# Patient Record
Sex: Female | Born: 1980 | Race: Asian | Hispanic: No | Smoking: Never smoker
Health system: Southern US, Community
[De-identification: ages and names within clinical notes are randomized; demographics above are authoritative.]

## PROBLEM LIST (undated history)

## (undated) ENCOUNTER — Emergency Department (HOSPITAL_COMMUNITY): Payer: Medicaid Other

## (undated) ENCOUNTER — Inpatient Hospital Stay (HOSPITAL_COMMUNITY): Payer: Self-pay

## (undated) DIAGNOSIS — N83209 Unspecified ovarian cyst, unspecified side: Secondary | ICD-10-CM

---

## 2007-10-15 HISTORY — PX: OTHER SURGICAL HISTORY: SHX169

## 2009-10-14 DIAGNOSIS — N83209 Unspecified ovarian cyst, unspecified side: Secondary | ICD-10-CM

## 2009-10-14 HISTORY — DX: Unspecified ovarian cyst, unspecified side: N83.209

## 2010-07-31 ENCOUNTER — Encounter: Admission: RE | Admit: 2010-07-31 | Discharge: 2010-07-31 | Payer: Self-pay | Admitting: Family Medicine

## 2010-08-07 ENCOUNTER — Encounter: Admission: RE | Admit: 2010-08-07 | Discharge: 2010-08-07 | Payer: Self-pay | Admitting: Family Medicine

## 2011-10-15 NOTE — L&D Delivery Note (Signed)
Delivery Note At 1:42 PM a viable female was delivered via Vaginal, Spontaneous Delivery (Presentation: Left Occiput Anterior).  APGAR: 8, 9; weight 7 lb 10.6 oz (3475 g).   Placenta status: Intact, Spontaneous.  Cord: 3 vessels with the following complications: None.  Cord pH: none  Anesthesia: Epidural  Episiotomy: None Lacerations: 2nd degree Suture Repair: 2.0 chromic Est. Blood Loss (mL): 350  Mom to postpartum.  Baby to nursery-stable.  HARPER,CHARLES A 08/29/2012, 3:48 PM

## 2011-10-28 ENCOUNTER — Ambulatory Visit: Payer: Self-pay

## 2011-10-28 DIAGNOSIS — B373 Candidiasis of vulva and vagina: Secondary | ICD-10-CM

## 2011-10-28 DIAGNOSIS — N898 Other specified noninflammatory disorders of vagina: Secondary | ICD-10-CM

## 2011-10-28 DIAGNOSIS — N760 Acute vaginitis: Secondary | ICD-10-CM

## 2012-01-19 ENCOUNTER — Ambulatory Visit: Payer: Self-pay | Admitting: Family Medicine

## 2012-01-19 VITALS — BP 121/67 | HR 83 | Temp 98.2°F | Resp 16 | Ht 60.0 in | Wt 104.0 lb

## 2012-01-19 DIAGNOSIS — N912 Amenorrhea, unspecified: Secondary | ICD-10-CM

## 2012-01-19 DIAGNOSIS — Z3201 Encounter for pregnancy test, result positive: Secondary | ICD-10-CM

## 2012-01-19 LAB — POCT URINE PREGNANCY: Preg Test, Ur: POSITIVE

## 2012-01-19 MED ORDER — PRENATAL VITAMINS (DIS) PO TABS: 1.0000 | ORAL_TABLET | Freq: Every morning | ORAL | Status: DC

## 2012-01-19 MED ORDER — ONDANSETRON 4 MG PO TBDP: 4.0000 mg | ORAL_TABLET | Freq: Three times a day (TID) | ORAL | Status: DC | PRN

## 2012-01-19 NOTE — Progress Notes (Signed)
  S: 31 yo [place] woman who suspects she may be pregnant because she missed her period last month:  LMP 12/11/11.  Also vomiting intermittently x 1 week.  No abdominal pain.  Voiding normally.  She had vomiting with her latest pregnancy.  O:  Thin woman in NAD Results for orders placed in visit on 01/19/12  POCT URINE PREGNANCY      Component Value Range   Preg Test, Ur Positive      A:  New onset pregnancy  P:  Zofran Prenatal vitamins Refer to [employer]

## 2012-01-19 NOTE — Patient Instructions (Signed)
Call [facility]  At [phone] for pregnancy care    Pregnancy If you are planning on getting pregnant, it is a good idea to make a preconception appointment with your care- giver to discuss having a healthy lifestyle before getting pregnant. Such as, diet, weight, exercise, taking prenatal vitamins especially folic acid (it helps prevent brain and spinal cord defects), avoiding alcohol, smoking and illegal drugs, medical problems (diabetes, convulsions), family history of genetic problems, working conditions and immunizations. It is better to have knowledge of these things and do something about them before getting pregnant. In your pregnancy, it is important to follow certain guidelines to have a healthy baby. It is very important to get good prenatal care and follow your caregiver's instructions. Prenatal care includes all the medical care you receive before your baby's birth. This helps to prevent problems during the pregnancy and childbirth. HOME CARE INSTRUCTIONS   Start your prenatal visits by the 12th week of pregnancy or before when possible. They are usually scheduled monthly at first. They are more often in the last 2 months before delivery. It is important that you keep your caregiver's appointments and follow your caregiver's instructions regarding medication use, exercise, and diet.   During pregnancy, you are providing food for you and your baby. Eat a regular, well-balanced diet. Choose foods such as meat, fish, milk and other dairy products, vegetables, fruits, whole-grain breads and cereals. Your caregiver will inform you of the ideal weight gain depending on your current height and weight. Drink lots of liquids. Try to drink 8 glasses of water a day.   Alcohol is associated with a number of birth defects including fetal alcohol syndrome. It is best to avoid alcohol completely. Smoking will cause low birth rate and prematurity. Use of alcohol and nicotine during your pregnancy also  increases the chances that your child will be chemically dependent later in their life and may contribute to SIDS (Sudden Infant Death Syndrome).   Do not use illegal drugs.   Only take prescription or over-the-counter medications that are recommended by your caregiver. Other medications can cause genetic and physical problems in the baby.   Morning sickness can often be helped by keeping soda crackers at the bedside. Eat a couple before arising in the morning.   A sexual relationship may be continued until near the end of pregnancy if there are no other problems such as early (premature) leaking of amniotic fluid from the membranes, vaginal bleeding, painful intercourse or belly (abdominal) pain.   Exercise regularly. Check with your caregiver if you are unsure of the safety of some of your exercises.   Do not use hot tubs, steam rooms or saunas. These increase the risk of fainting or passing out and hurting yourself and the baby. Swimming is OK for exercise. Get plenty of rest, including afternoon naps when possible especially in the third trimester.   Avoid toxic odors and chemicals.   Do not wear high heels. They may cause you to lose your balance and fall.   Do not lift over 5 pounds. If you do lift anything, lift with your legs and thighs, not your back.   Avoid long trips, especially in the third trimester.   If you have to travel out of the city or state, take a copy of your medical records with you.  SEEK IMMEDIATE MEDICAL CARE IF:   You develop an unexplained oral temperature above 102 F (38.9 C), or as your caregiver suggests.   You have  leaking of fluid from the vagina. If leaking membranes are suspected, take your temperature and inform your caregiver of this when you call.   There is vaginal spotting or bleeding. Notify your caregiver of the amount and how many pads are used.   You continue to feel sick to your stomach (nauseous) and have no relief from remedies  suggested, or you throw up (vomit) blood or coffee ground like materials.   You develop upper abdominal pain.   You have round ligament discomfort in the lower abdominal area. This still must be evaluated by your caregiver.   You feel contractions of the uterus.   You do not feel the baby move, or there is less movement than before.   You have painful urination.   You have abnormal vaginal discharge.   You have persistent diarrhea.   You get a severe headache.   You have problems with your vision.   You develop muscle weakness.   You feel dizzy and faint.   You develop shortness of breath.   You develop chest pain.   You have back pain that travels down to your leg and feet.   You feel irregular or a very fast heartbeat.   You develop excessive weight gain in a short period of time (5 pounds in 3 to 5 days).   You are involved with a domestic violence situation.  Document Released: 09/30/2005 Document Revised: 09/19/2011 Document Reviewed: 03/24/2009 [facility] Patient Information 2012 [place], [place].

## 2012-01-27 ENCOUNTER — Encounter (HOSPITAL_COMMUNITY): Payer: Self-pay

## 2012-01-27 ENCOUNTER — Inpatient Hospital Stay (HOSPITAL_COMMUNITY): Payer: Medicaid Other

## 2012-01-27 ENCOUNTER — Inpatient Hospital Stay (HOSPITAL_COMMUNITY)
Admission: AD | Admit: 2012-01-27 | Discharge: 2012-01-27 | Disposition: A | Discharge status: Home / Self Care | Payer: Medicaid Other | Source: Ambulatory Visit | Attending: Obstetrics and Gynecology | Admitting: Obstetrics and Gynecology

## 2012-01-27 DIAGNOSIS — O209 Hemorrhage in early pregnancy, unspecified: Secondary | ICD-10-CM | POA: Insufficient documentation

## 2012-01-27 HISTORY — DX: Unspecified ovarian cyst, unspecified side: N83.209

## 2012-01-27 LAB — CBC
Platelets: 306 10*3/uL (ref 150–400)
RBC: 4.96 MIL/uL (ref 3.87–5.11)
RDW: 12.4 % (ref 11.5–15.5)
WBC: 11.5 10*3/uL — ABNORMAL HIGH (ref 4.0–10.5)

## 2012-01-27 LAB — HCG, QUANTITATIVE, PREGNANCY: hCG, Beta Chain, Quant, S: 179394 m[IU]/mL — ABNORMAL HIGH (ref <5)

## 2012-01-27 LAB — WET PREP, GENITAL
Trich, Wet Prep: NONE SEEN
Yeast Wet Prep HPF POC: NONE SEEN

## 2012-01-27 NOTE — Discharge Instructions (Signed)
Pelvic Rest Pelvic rest is sometimes recommended for women when:   The placenta is partially or completely covering the opening of the cervix (placenta previa).   There is bleeding between the uterine wall and the amniotic sac in the first trimester (subchorionic hemorrhage).   The cervix begins to open without labor starting (incompetent cervix, cervical insufficiency).   The labor is too early (preterm labor).  HOME CARE INSTRUCTIONS  Do not have sexual intercourse, stimulation, or an orgasm.   Do not use tampons, douche, or put anything in the vagina.   Do not lift anything over 10 pounds (4.5 kg).   Avoid strenuous activity or straining your pelvic muscles.  SEEK MEDICAL CARE IF:  You have any vaginal bleeding during pregnancy. Treat this as a potential emergency.   You have cramping pain felt low in the stomach (stronger than menstrual cramps).   You notice vaginal discharge (watery, mucus, or bloody).   You have a low, dull backache.   There are regular contractions or uterine tightening.  SEEK IMMEDIATE MEDICAL CARE IF: You have vaginal bleeding and have placenta previa.  Document Released: 01/25/2011 Document Revised: 09/19/2011 Document Reviewed: 01/25/2011 [employer] Patient Information 2012 [employer].Vaginal Bleeding During Pregnancy, First Trimester A small amount of bleeding (spotting) is relatively common in early pregnancy. It usually stops on its own. There are many causes for bleeding or spotting in early pregnancy. Some bleeding may be related to the pregnancy and some may not. Cramping with the bleeding is more serious and concerning. Tell your caregiver if you have any vaginal bleeding.  CAUSES   It is normal in most cases.   The pregnancy ends (miscarriage).   The pregnancy may end (threatened miscarriage).   Infection or inflammation of the cervix.   Growths (polyps) on the cervix.   Pregnancy happens outside of the uterus and in a fallopian  tube (tubal pregnancy).   Many tiny cysts in the uterus instead of pregnancy tissue (molar pregnancy).  SYMPTOMS  Vaginal bleeding or spotting with or without cramps. DIAGNOSIS  To evaluate the pregnancy, your caregiver may:  Do a pelvic exam.   Take blood tests.   Do an ultrasound.  It is very important to follow your caregiver's instructions.  TREATMENT   Evaluation of the pregnancy with blood tests and ultrasound.   Bed rest (getting up to use the bathroom only).   Rho-gam immunization if the mother is Rh negative and the father is Rh positive.  HOME CARE INSTRUCTIONS   If your caregiver orders bed rest, you may need to make arrangements for the care of other children and for other responsibilities. However, your caregiver may allow you to continue light activity.   Keep track of the number of pads you use each day, how often you change pads and how soaked (saturated) they are. Write this down.   Do not use tampons. Do not douche.   Do not have sexual intercourse or orgasms until approved by your physician.   Save any tissue that you pass for your caregiver to see.   Take medicine for cramps only with your caregiver's permission.   Do not take aspirin because it can make you bleed.  SEEK IMMEDIATE MEDICAL CARE IF:   You experience severe cramps in your stomach, back or belly (abdomen).   You have an oral temperature above 102 F (38.9 C), not controlled by medicine.   You pass large clots or tissue.   Your bleeding increases or you become   light-headed, weak or have fainting episodes.   You develop chills.   You are leaking or have a gush of fluid from your vagina.   You pass out while having a bowel movement. That may mean you have a ruptured tubal pregnancy.  Document Released: 07/10/2005 Document Revised: 09/19/2011 Document Reviewed: 01/19/2009 [employer] Patient Information 2012 [employer]. 

## 2012-01-27 NOTE — MAU Note (Signed)
Patient states she had a positive pregnancy test at Urgent Care. Started having spotting at 1000. Has some off and on pain in lower abdomen and lower back.

## 2012-01-27 NOTE — MAU Provider Note (Signed)
History   Alexandra Lynn is a 31 y.o. year old G68P1001 female at [redacted]w[redacted]d weeks gestation by LMP who presents to MAU reporting vaginal bleeding lighter than a period and light cramping. She denies passage of tissue or any urinary complaints.    CSN: [id number]  Arrival date and time: 01/27/12 1058   First Provider Initiated Contact with Patient 01/27/12 1411      Chief Complaint  Patient presents with  . Vaginal Bleeding   HPI  OB History    Grav Para Term Preterm Abortions TAB SAB Ect Mult Living   2 1 1  0 0 0 0 0 0 1      Past Medical History  Diagnosis Date  . Ovarian cyst 2011    Past Surgical History  Procedure Date  . Laproscopic 2009    removal of fluid from ovary    Family History  Problem Relation Age of Onset  . Hearing loss Neg Hx   . Anesthesia problems Neg Hx     History  Substance Use Topics  . Smoking status: Never Smoker   . Smokeless tobacco: Never Used  . Alcohol Use: No    Allergies: No Known Allergies  Prescriptions prior to admission  Medication Sig Dispense Refill  . Prenatal Vit-Fe Fumarate-FA (PRENATAL MULTIVITAMIN) TABS Take 1 tablet by mouth daily.        ROS: Otherwise neg Physical Exam   Blood pressure 107/64, pulse 75, temperature 97.3 F (36.3 C), temperature source Oral, resp. rate 16, height 4\' 11"  (1.499 m), weight 46.811 kg (103 lb 3.2 oz), last menstrual period 12/05/2011, SpO2 100.00%.  Physical Exam  Nursing note and vitals reviewed. Constitutional: She is oriented to person, place, and time. She appears well-developed and well-nourished. No distress.  Cardiovascular: Normal rate.   Respiratory: Effort normal.  GI: Soft.  Genitourinary: There is no lesion on the right labia. There is no lesion on the left labia. Cervix exhibits friability. Cervix exhibits no discharge. There is bleeding (scant amount of brown blood in vault) around the vagina. No vaginal discharge found.       Declined bimanual exam. Cervix tightly  closed when GC/CT swab introduced.   Lymphadenopathy:       Right: No inguinal adenopathy present.       Left: No inguinal adenopathy present.  Neurological: She is alert and oriented to person, place, and time.  Skin: Skin is warm and dry.  Psychiatric: She has a normal mood and affect.   Results for orders placed during the hospital encounter of 01/27/12 (from the past 24 hour(s))  CBC     Status: Abnormal   Collection Time   01/27/12 11:25 AM      Component Value Range   WBC 11.5 (*) 4.0 - 10.5 (K/uL)   RBC 4.96  3.87 - 5.11 (MIL/uL)   Hemoglobin 13.2  12.0 - 15.0 (g/dL)   HCT 14.7  82.9 - 56.2 (%)   MCV 80.2  78.0 - 100.0 (fL)   MCH 26.6  26.0 - 34.0 (pg)   MCHC 33.2  30.0 - 36.0 (g/dL)   RDW 13.0  86.5 - 78.4 (%)   Platelets 306  150 - 400 (K/uL)  ABO/RH     Status: Normal   Collection Time   01/27/12 11:25 AM      Component Value Range   ABO/RH(D) B POS    HCG, QUANTITATIVE, PREGNANCY     Status: Abnormal   Collection Time   01/27/12 11:25 AM  Component Value Range   hCG, Beta Chain, [place] Longest [id number] (*) <5 (mIU/mL)   [place]: SIUP, S=D  MAU Course  Procedures   Assessment and Plan   1. First trimester bleeding    D/C home Start [facility] Pelvic rest x 1 week SAB precautions Prenatal provider list given  Dorathy Kinsman 01/27/2012, 2:41 PM

## 2012-01-27 NOTE — MAU Note (Signed)
Had bleeding this morning around 1000, like period. One or two small clots. No active bleeding at this time.

## 2012-01-30 NOTE — MAU Provider Note (Signed)
Agree with above note.  Alexandra Lynn 01/30/2012 11:15 AM

## 2012-03-24 LAB — OB RESULTS CONSOLE HIV ANTIBODY (ROUTINE TESTING): HIV: NONREACTIVE

## 2012-03-24 LAB — OB RESULTS CONSOLE ANTIBODY SCREEN: Antibody Screen: NEGATIVE

## 2012-03-24 LAB — OB RESULTS CONSOLE RPR: RPR: NONREACTIVE

## 2012-03-27 ENCOUNTER — Other Ambulatory Visit: Payer: Self-pay | Admitting: Obstetrics & Gynecology

## 2012-03-27 DIAGNOSIS — Z3689 Encounter for other specified antenatal screening: Secondary | ICD-10-CM

## 2012-04-23 ENCOUNTER — Ambulatory Visit (HOSPITAL_COMMUNITY)
Admission: RE | Admit: 2012-04-23 | Discharge: 2012-04-23 | Disposition: A | Discharge status: Home / Self Care | Payer: Medicaid Other | Source: Ambulatory Visit | Attending: Obstetrics & Gynecology | Admitting: Obstetrics & Gynecology

## 2012-04-23 DIAGNOSIS — Z363 Encounter for antenatal screening for malformations: Secondary | ICD-10-CM | POA: Insufficient documentation

## 2012-04-23 DIAGNOSIS — O358XX0 Maternal care for other (suspected) fetal abnormality and damage, not applicable or unspecified: Secondary | ICD-10-CM | POA: Insufficient documentation

## 2012-04-23 DIAGNOSIS — Z3689 Encounter for other specified antenatal screening: Secondary | ICD-10-CM

## 2012-04-23 DIAGNOSIS — Z1389 Encounter for screening for other disorder: Secondary | ICD-10-CM | POA: Insufficient documentation

## 2012-05-21 ENCOUNTER — Other Ambulatory Visit: Payer: Self-pay

## 2012-07-06 ENCOUNTER — Encounter (HOSPITAL_COMMUNITY): Payer: Self-pay | Admitting: Obstetrics & Gynecology

## 2012-07-10 ENCOUNTER — Ambulatory Visit (HOSPITAL_COMMUNITY)
Admission: RE | Admit: 2012-07-10 | Discharge: 2012-07-10 | Disposition: A | Discharge status: Home / Self Care | Payer: Medicaid Other | Source: Ambulatory Visit | Attending: Obstetrics & Gynecology | Admitting: Obstetrics & Gynecology

## 2012-07-13 DIAGNOSIS — D560 Alpha thalassemia: Secondary | ICD-10-CM | POA: Insufficient documentation

## 2012-07-13 NOTE — Progress Notes (Signed)
Genetic Counseling  High-Risk Gestation Note  Appointment Date:  07/10/2012 Referred By: Antionette Char, MD Date of Birth:  29-May-1981    Pregnancy History: G2P1001 Estimated Date of Delivery: 09/10/12 Estimated Gestational Age: [redacted]w[redacted]d Attending: Particia Nearing, MD    Ms. Nicolette Bang Nanni was seen for genetic counseling because of hemoglobin electrophoresis indicated that she carries Hemoglobin Constant Spring, and her husband carries Hemoglobin E.  She was accompanied by her sister-in-law. Rhade ([place])/English interpretation provided by [employer] [phone] via telephone.   Ms. 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 Mr. Anastasio Auerbach, the father of the pregnancy, had hemoglobin electrophoresis performed through the patient's OB office, which revealed that he carries (heterozygous) Hemoglobin E (Hb E). We reviewed both family histories, and there are no known reported relatives with hemoglobin variants or a hemoglobinopathy. The family histories are noncontributory for birth defects, mental retardation, and known genetic conditions. Without further information regarding the provided family history, an accurate genetic risk cannot be calculated. Further genetic counseling is warranted if more information is obtained.  We discussed that hemoglobin is the oxygen-carrying pigment of red blood cells.  The type of hemoglobin we have is determined by inheritance.  There are different genes that make hemoglobin and its subunits; examples include alpha globin genes and beta globin genes.  It is not uncommon for a mistake to exist in a hemoglobin gene which may produce a hemoglobin variant. We reviewed the autosomal recessive inheritance of hemoglobinopathies. A pregnancy would have a 1 in 4 (25%) chance to inherit a hemoglobinopathy, such as thalassemia, when both  parents are carriers of a hemoglobin variant affecting the same hemoglobin chain (such as both parents carrying beta globin variants).   Alpha thalassemia differs in its inheritance from beta thalassemia as there are two alpha globin genes on each chromosome 16 (aa/aa).  A person can be a carrier of one alpha gene mutation (aa/a-) or of more than one mutation. Alpha thalassemia trait typically results from carrying 2 alpha globin genes with deletions (and 2 functional copies of the gene).  A person that carries only one hemoglobin variant typically has no related health concerns beyond mild anemia in some cases. A person who carriers two alpha globin gene mutations resulting in deletions can either carry them in cis (both on the same chromosome aa/--) or in trans (on different chromosomes a-/a-) .  Southeast Asian alpha thalassemia carriers usually have a cis arrangement (aa/--) of their alpha globin gene mutations.  Thus, carriers of two alpha globin gene deletions are at risk for having a child with the most severe form of alpha thalassemia, hydrops fetalis with Hb Barts, which is associated with an absence of alpha globin chain synthesis as a result of deletions of all four alpha globin genes (--/--). Alpha thalassemia carriers of African descent, typically carry the alpha globin gene mutations in trans (a-/a-). Individuals who carry the alpha thalassemia mutations in trans are not at risk to have a baby with Hb Barts disease, but could be at risk to have a baby with hemoglobin H disease (--/-a), if their partner has alpha thalassemia trait and carries the mutations in cis.  In the case where both partners carry alpha thalassemia trait in trans (a-/a-), all of their offspring would be expected to inherit alpha thalassemia trait in trans (a-/a-).  Hemoglobin Constant Spring (Hb CS) is a non-deleted alpha chain hemoglobin variant that is  relatively common among Southeast Asians.  Hb CS can lead to Hemoglobin H  disease when inherited along with alpha thalassemia trait in cis (aa/--).    Hemoglobin E is a hemoglobin variant that results due to a mutation in the beta globin gene.  A baby is at risk to inherit a hemoglobinopathy such as hemoglobin E-beta thalassemia, when both parents carry a beta globin variant, such as hemoglobin E or beta thalassemia trait. Hemoglobin EE disease, for which a fetus would be at risk if both parents carried hemoglobin E, is generally benign.   We discussed that the hemoglobin variants carried by the couple affect different subunits of hemoglobin; Ms. Merlin carries an alpha globin chain variant (Hb CS), and Mr. Anastasio Auerbach carries a beta globin chain variant (Hb E). There are reports of hemoglobin E (beta globin variant) trait and co-inheritance of alpha thalassemia traits. Individuals who are heterozygous for Hb E and for alpha-thalassemia trait of one deletion (-a/aa) or two deletions (--/aa) reportedly have normal clinical features. Whereas co-inheritance of Hb E and Hemoglobin H (--/-a) results in intermediate thalassemia called HbAE Bart's disease. For individuals at risk to inherit beta-thalassemia or Hb E-beta thalassemia, co-inheritance with alpha globin chain variant, Hemoglobin Constant Spring, has been reported to lead to less severe anemia and milder phenotype. However, the current pregnancy would not be expected to be at risk to inherit Hemoglobin H (and thus also not at risk to inherit HbAE Bart's disease), given that the patient does not carry two alpha globin deletions, and the father of the pregnancy reportedly does not carry an alpha globin deletion, according to their hemoglobin electrophoresis results. Additionally, the pregnancy would not be expected to be at risk to inherit Hb E-beta thalassemia, given that Ms. Mane is not indicated to carry a beta globin chain variant, according to her hemoglobin electrophoresis result.   The current pregnancy has a 1 in 2 (50%)  chance to inherit hemoglobin E trait, given Mr. Molli Hazard carrier status. The baby also would have a 1 in 2 (50%) chance to carry Hemoglobin Constant Spring, an alpha globin variant, given that Mr. Anastasio Auerbach does not appear to have alpha thalassemia trait. Given that the couple do not carry variants that affect the same subunit of hemoglobin, and given the nature of the alpha-globin chain variant for Ms. Strader (non-deleted alpha chain variant), according to their hemoglobin electrophoresis, the pregnancy is not expected to be at increased risk to inherit a hemoglobinopathy.  We discussed that prenatal ultrasound would not be expected to detect features of a hemoglobinopathy in most cases, aside from hemoglobin Barts disease, for which this pregnancy is not expected to be at risk. We discussed that hemoglobinopathies are routinely screened for as part of the [place] newborn screening panel.    Ms. ,Leanny Kelder was provided with written information regarding cystic fibrosis (CF) including the carrier frequency and incidence in the Caucasian population, the availability of carrier testing and prenatal diagnosis if indicated.  In addition, we discussed that CF is routinely screened for as part of the  newborn screening panel.  She declined testing today.   Ms. Patchett denied exposure to environmental toxins or chemical agents. She denied the use of alcohol, tobacco or street drugs. She denied significant viral illnesses during the course of her pregnancy. Her medical and surgical histories were noncontributory.   I counseled Ms. Diana Westrich regarding the above risks and available options.  The approximate face-to-face time with the genetic counselor was 45 minutes.  Clydie Braun  Rosabel Sermeno, MS,  Certified Genetic Counselor  07/13/2012

## 2012-08-29 ENCOUNTER — Inpatient Hospital Stay (HOSPITAL_COMMUNITY)
Admission: AD | Admit: 2012-08-29 | Discharge: 2012-08-31 | DRG: 775 | Disposition: A | Discharge status: Home / Self Care | Payer: Medicaid Other | Source: Ambulatory Visit | Attending: Obstetrics & Gynecology | Admitting: Obstetrics & Gynecology

## 2012-08-29 ENCOUNTER — Encounter (HOSPITAL_COMMUNITY): Payer: Self-pay | Admitting: Anesthesiology

## 2012-08-29 ENCOUNTER — Encounter (HOSPITAL_COMMUNITY): Payer: Self-pay

## 2012-08-29 ENCOUNTER — Inpatient Hospital Stay (HOSPITAL_COMMUNITY): Payer: Medicaid Other | Admitting: Anesthesiology

## 2012-08-29 DIAGNOSIS — D560 Alpha thalassemia: Secondary | ICD-10-CM

## 2012-08-29 DIAGNOSIS — Z22330 Carrier of Group B streptococcus: Secondary | ICD-10-CM

## 2012-08-29 DIAGNOSIS — D649 Anemia, unspecified: Secondary | ICD-10-CM | POA: Diagnosis not present

## 2012-08-29 DIAGNOSIS — O99892 Other specified diseases and conditions complicating childbirth: Secondary | ICD-10-CM | POA: Diagnosis present

## 2012-08-29 DIAGNOSIS — O9903 Anemia complicating the puerperium: Secondary | ICD-10-CM | POA: Diagnosis not present

## 2012-08-29 LAB — CBC
HCT: 33.4 % — ABNORMAL LOW (ref 36.0–46.0)
RDW: 15.1 % (ref 11.5–15.5)
WBC: 9.5 10*3/uL (ref 4.0–10.5)

## 2012-08-29 MED ORDER — CITRIC ACID-SODIUM CITRATE 334-500 MG/5ML PO SOLN: 30.0000 mL | ORAL | Status: DC | PRN

## 2012-08-29 MED ORDER — LIDOCAINE HCL (PF) 1 % IJ SOLN
INTRAMUSCULAR | Status: DC | PRN
  Administered 2012-08-29 (×3): 4 mL

## 2012-08-29 MED ORDER — ONDANSETRON HCL 4 MG PO TABS: 4.0000 mg | ORAL_TABLET | ORAL | Status: DC | PRN

## 2012-08-29 MED ORDER — LACTATED RINGERS IV SOLN: 500.0000 mL | INTRAVENOUS | Status: DC | PRN

## 2012-08-29 MED ORDER — LANOLIN HYDROUS EX OINT: TOPICAL_OINTMENT | CUTANEOUS | Status: DC | PRN

## 2012-08-29 MED ORDER — ONDANSETRON HCL 4 MG/2ML IJ SOLN: 4.0000 mg | INTRAMUSCULAR | Status: DC | PRN

## 2012-08-29 MED ORDER — PROMETHAZINE HCL 25 MG/ML IJ SOLN
12.5000 mg | Freq: Four times a day (QID) | INTRAMUSCULAR | Status: DC | PRN
  Administered 2012-08-29: 12.5 mg via INTRAVENOUS
  Filled 2012-08-29: qty 1

## 2012-08-29 MED ORDER — ZOLPIDEM TARTRATE 5 MG PO TABS: 5.0000 mg | ORAL_TABLET | Freq: Every evening | ORAL | Status: DC | PRN

## 2012-08-29 MED ORDER — EPHEDRINE 5 MG/ML INJ
10.0000 mg | INTRAVENOUS | Status: DC | PRN
  Filled 2012-08-29: qty 4

## 2012-08-29 MED ORDER — DIPHENHYDRAMINE HCL 25 MG PO CAPS: 25.0000 mg | ORAL_CAPSULE | Freq: Four times a day (QID) | ORAL | Status: DC | PRN

## 2012-08-29 MED ORDER — ONDANSETRON HCL 4 MG/2ML IJ SOLN
4.0000 mg | Freq: Four times a day (QID) | INTRAMUSCULAR | Status: DC | PRN
  Administered 2012-08-29: 4 mg via INTRAVENOUS
  Filled 2012-08-29: qty 2

## 2012-08-29 MED ORDER — LACTATED RINGERS IV SOLN
Freq: Once | INTRAVENOUS | Status: AC
  Administered 2012-08-29: 03:00:00 via INTRAVENOUS

## 2012-08-29 MED ORDER — OXYTOCIN 40 UNITS IN LACTATED RINGERS INFUSION - SIMPLE MED: 62.5000 mL/h | INTRAVENOUS | Status: DC | PRN

## 2012-08-29 MED ORDER — ACETAMINOPHEN 325 MG PO TABS: 650.0000 mg | ORAL_TABLET | ORAL | Status: DC | PRN

## 2012-08-29 MED ORDER — TETANUS-DIPHTH-ACELL PERTUSSIS 5-2.5-18.5 LF-MCG/0.5 IM SUSP: 0.5000 mL | Freq: Once | INTRAMUSCULAR | Status: DC

## 2012-08-29 MED ORDER — IBUPROFEN 600 MG PO TABS: 600.0000 mg | ORAL_TABLET | Freq: Four times a day (QID) | ORAL | Status: DC | PRN

## 2012-08-29 MED ORDER — SIMETHICONE 80 MG PO CHEW: 80.0000 mg | CHEWABLE_TABLET | ORAL | Status: DC | PRN

## 2012-08-29 MED ORDER — PHENYLEPHRINE 40 MCG/ML (10ML) SYRINGE FOR IV PUSH (FOR BLOOD PRESSURE SUPPORT): 80.0000 ug | PREFILLED_SYRINGE | INTRAVENOUS | Status: DC | PRN

## 2012-08-29 MED ORDER — LIDOCAINE HCL (PF) 1 % IJ SOLN
30.0000 mL | INTRAMUSCULAR | Status: AC | PRN
  Administered 2012-08-29: 30 mL via SUBCUTANEOUS
  Filled 2012-08-29: qty 30

## 2012-08-29 MED ORDER — MEDROXYPROGESTERONE ACETATE 150 MG/ML IM SUSP: 150.0000 mg | INTRAMUSCULAR | Status: DC | PRN

## 2012-08-29 MED ORDER — IBUPROFEN 600 MG PO TABS: 600.0000 mg | ORAL_TABLET | Freq: Four times a day (QID) | ORAL | Status: DC

## 2012-08-29 MED ORDER — NALBUPHINE SYRINGE 5 MG/0.5 ML
10.0000 mg | INJECTION | Freq: Once | INTRAMUSCULAR | Status: AC
  Administered 2012-08-29: 10 mg via INTRAVENOUS
  Filled 2012-08-29: qty 1

## 2012-08-29 MED ORDER — PRENATAL MULTIVITAMIN CH
1.0000 | ORAL_TABLET | Freq: Every day | ORAL | Status: DC
  Administered 2012-08-31: 1 via ORAL

## 2012-08-29 MED ORDER — OXYTOCIN 40 UNITS IN LACTATED RINGERS INFUSION - SIMPLE MED
62.5000 mL/h | INTRAVENOUS | Status: DC
  Administered 2012-08-29: 62.5 mL/h via INTRAVENOUS
  Filled 2012-08-29: qty 1000

## 2012-08-29 MED ORDER — PRENATAL MULTIVITAMIN CH: 1.0000 | ORAL_TABLET | Freq: Every day | ORAL | Status: DC

## 2012-08-29 MED ORDER — NALBUPHINE SYRINGE 5 MG/0.5 ML
10.0000 mg | INJECTION | INTRAMUSCULAR | Status: DC | PRN
  Administered 2012-08-29 (×2): 10 mg via INTRAVENOUS
  Filled 2012-08-29 (×2): qty 1

## 2012-08-29 MED ORDER — LACTATED RINGERS IV SOLN
500.0000 mL | Freq: Once | INTRAVENOUS | Status: AC
  Administered 2012-08-29: 500 mL via INTRAVENOUS

## 2012-08-29 MED ORDER — FENTANYL 2.5 MCG/ML BUPIVACAINE 1/10 % EPIDURAL INFUSION (WH - ANES)
14.0000 mL/h | INTRAMUSCULAR | Status: DC
  Administered 2012-08-29: 12 mL/h via EPIDURAL
  Filled 2012-08-29: qty 125

## 2012-08-29 MED ORDER — WITCH HAZEL-GLYCERIN EX PADS
1.0000 "application " | MEDICATED_PAD | CUTANEOUS | Status: DC | PRN
  Administered 2012-08-29: 1 via TOPICAL

## 2012-08-29 MED ORDER — BENZOCAINE-MENTHOL 20-0.5 % EX AERO
1.0000 "application " | INHALATION_SPRAY | CUTANEOUS | Status: DC | PRN
  Administered 2012-08-29: 1 via TOPICAL
  Filled 2012-08-29: qty 56

## 2012-08-29 MED ORDER — DIBUCAINE 1 % RE OINT
1.0000 "application " | TOPICAL_OINTMENT | RECTAL | Status: DC | PRN
  Administered 2012-08-30: 1 via RECTAL
  Filled 2012-08-29: qty 28

## 2012-08-29 MED ORDER — OXYTOCIN BOLUS FROM INFUSION: 500.0000 mL | INTRAVENOUS | Status: DC

## 2012-08-29 MED ORDER — EPHEDRINE 5 MG/ML INJ: 10.0000 mg | INTRAVENOUS | Status: DC | PRN

## 2012-08-29 MED ORDER — BENZOCAINE-MENTHOL 20-0.5 % EX AERO: 1.0000 "application " | INHALATION_SPRAY | CUTANEOUS | Status: DC | PRN

## 2012-08-29 MED ORDER — FLEET ENEMA 7-19 GM/118ML RE ENEM: 1.0000 | ENEMA | RECTAL | Status: DC | PRN

## 2012-08-29 MED ORDER — WITCH HAZEL-GLYCERIN EX PADS: 1.0000 "application " | MEDICATED_PAD | CUTANEOUS | Status: DC | PRN

## 2012-08-29 MED ORDER — DIPHENHYDRAMINE HCL 50 MG/ML IJ SOLN: 12.5000 mg | INTRAMUSCULAR | Status: DC | PRN

## 2012-08-29 MED ORDER — TETANUS-DIPHTH-ACELL PERTUSSIS 5-2.5-18.5 LF-MCG/0.5 IM SUSP
0.5000 mL | Freq: Once | INTRAMUSCULAR | Status: AC
  Administered 2012-08-30: 0.5 mL via INTRAMUSCULAR
  Filled 2012-08-29: qty 0.5

## 2012-08-29 MED ORDER — OXYCODONE-ACETAMINOPHEN 5-325 MG PO TABS: 1.0000 | ORAL_TABLET | ORAL | Status: DC | PRN

## 2012-08-29 MED ORDER — SODIUM CHLORIDE 0.9 % IV SOLN
2.0000 g | Freq: Once | INTRAVENOUS | Status: AC
  Administered 2012-08-29: 2 g via INTRAVENOUS
  Filled 2012-08-29: qty 2000

## 2012-08-29 MED ORDER — INFLUENZA VIRUS VACC SPLIT PF IM SUSP
0.5000 mL | INTRAMUSCULAR | Status: AC
  Administered 2012-08-30: 0.5 mL via INTRAMUSCULAR
  Filled 2012-08-29: qty 0.5

## 2012-08-29 MED ORDER — PHENYLEPHRINE 40 MCG/ML (10ML) SYRINGE FOR IV PUSH (FOR BLOOD PRESSURE SUPPORT)
80.0000 ug | PREFILLED_SYRINGE | INTRAVENOUS | Status: DC | PRN
  Filled 2012-08-29: qty 5

## 2012-08-29 MED ORDER — SENNOSIDES-DOCUSATE SODIUM 8.6-50 MG PO TABS
2.0000 | ORAL_TABLET | Freq: Every day | ORAL | Status: DC
Start: 2012-08-29 — End: 2012-08-29

## 2012-08-29 MED ORDER — SENNOSIDES-DOCUSATE SODIUM 8.6-50 MG PO TABS
2.0000 | ORAL_TABLET | Freq: Every day | ORAL | Status: DC
  Administered 2012-08-29 – 2012-08-30 (×2): 2 via ORAL

## 2012-08-29 MED ORDER — LACTATED RINGERS IV SOLN
INTRAVENOUS | Status: DC
  Administered 2012-08-29: 09:00:00 via INTRAVENOUS

## 2012-08-29 MED ORDER — IBUPROFEN 600 MG PO TABS
600.0000 mg | ORAL_TABLET | Freq: Four times a day (QID) | ORAL | Status: DC
  Administered 2012-08-29 – 2012-08-31 (×7): 600 mg via ORAL
  Filled 2012-08-29 (×7): qty 1

## 2012-08-29 MED ORDER — DIBUCAINE 1 % RE OINT: 1.0000 "application " | TOPICAL_OINTMENT | RECTAL | Status: DC | PRN

## 2012-08-29 NOTE — Anesthesia Procedure Notes (Signed)
Epidural Patient location during procedure: OB Start time: 08/29/2012 8:12 AM  Staffing Performed by: anesthesiologist   Preanesthetic Checklist Completed: patient identified, site marked, surgical consent, pre-op evaluation, timeout performed, IV checked, risks and benefits discussed and monitors and equipment checked  Epidural Patient position: sitting Prep: site prepped and draped and DuraPrep Patient monitoring: continuous pulse ox and blood pressure Approach: midline Injection technique: LOR air  Needle:  Needle type: Tuohy  Needle gauge: 17 G Needle length: 9 cm and 9 Needle insertion depth: 5 cm cm Catheter type: closed end flexible Catheter size: 19 Gauge Catheter at skin depth: 10 cm Test dose: negative  Assessment Events: blood not aspirated, injection not painful, no injection resistance, negative IV test and no paresthesia  Additional Notes Discussed risk of headache, infection, bleeding, nerve injury and failed or incomplete block.  Patient voices understanding and wishes to proceed. Procedure and R/B/A explanation performed with continuous interpretation by [place] interpreter over [employer].Reason for block:procedure for pain

## 2012-08-29 NOTE — H&P (Signed)
Taniesha Faucett is a 31 y.o. female presenting for UC's. Maternal Medical History:  Reason for admission: Reason for admission: contractions.  31 yo G2 P1  EDC 09-10-12.  Presents with UC's.  Contractions: Onset was 3-5 hours ago.   Frequency: regular.   Perceived severity is moderate.    Fetal activity: Perceived fetal activity is normal.   Last perceived fetal movement was within the past hour.    Prenatal complications: no prenatal complications Prenatal Complications - Diabetes: none.    OB History    Grav Para Term Preterm Abortions TAB SAB Ect Mult Living   2 1 1  0 0 0 0 0 0 1     Past Medical History  Diagnosis Date  . Ovarian cyst 2011   Past Surgical History  Procedure Date  . Laproscopic 2009    removal of fluid from ovary   Family History: family history is negative for Hearing loss and Anesthesia problems. Social History:  reports that she has never smoked. She has never used smokeless tobacco. She reports that she does not drink alcohol or use illicit drugs.   Prenatal Transfer Tool  Maternal Diabetes: No Genetic Screening: Normal Maternal Ultrasounds/Referrals: Normal Fetal Ultrasounds or other Referrals:  None Maternal Substance Abuse:  No Significant Maternal Medications:  Meds include: Other:  PNV Significant Maternal Lab Results:  Lab values include: Group B Strep positive Other Comments:  None  Review of Systems  All other systems reviewed and are negative.    Dilation: 7 Effacement (%): 90 Station: 0 Exam by:: T. Lessard RN Blood pressure 125/69, pulse 79, temperature 97.4 F (36.3 C), temperature source Oral, resp. rate 20, height 4\' 11"  (1.499 m), weight 58.174 kg (128 lb 4 oz), last menstrual period 12/05/2011, SpO2 100.00%. Maternal Exam:  Uterine Assessment: Contraction strength is moderate.  Contraction frequency is regular.   Abdomen: Patient reports no abdominal tenderness. Introitus: Normal vulva. Normal vagina.  Pelvis: adequate  for delivery.   Cervix: Cervix evaluated by digital exam.     Physical Exam  Nursing note and vitals reviewed. Constitutional: She is oriented to person, place, and time. She appears well-developed and well-nourished.  HENT:  Head: Normocephalic and atraumatic.  Eyes: Conjunctivae normal are normal. Pupils are equal, round, and reactive to light.  Neck: Normal range of motion. Neck supple.  Cardiovascular: Normal rate and regular rhythm.   Respiratory: Effort normal and breath sounds normal.  GI: Soft.  Genitourinary: Vagina normal and uterus normal.  Musculoskeletal: Normal range of motion.  Neurological: She is alert and oriented to person, place, and time.  Skin: Skin is warm and dry.  Psychiatric: She has a normal mood and affect. Her behavior is normal. Judgment and thought content normal.    Prenatal labs: ABO, Rh: --/--/B POS (11/16 0305) Antibody: NEG (11/16 0305) Rubella: Immune (06/11 0000) RPR: Nonreactive (06/11 0000)  HBsAg: Negative (06/11 0000)  HIV: Non-reactive (06/11 0000)  GBS: Positive (11/04 0000)   Assessment/Plan: 38 weeks.  Active labor.  Expectant management.   HARPER,CHARLES A 08/29/2012, 8:13 AM

## 2012-08-29 NOTE — Progress Notes (Signed)
Dr Clearance Coots notified of patient, current sve result. Order to admit,

## 2012-08-29 NOTE — MAU Note (Signed)
Patient is in with c/o ctx q2-3 minutes since midnight. Denies lof. Reports good fetal movement. She is accompanied by her husband and 33 years old son.

## 2012-08-29 NOTE — Progress Notes (Signed)
Dr Clearance Coots notified of patient, her complaints of ctx q2-3 since midnight, se result. Order to monitor for an hour and recheck, infuse 1liter LR bolus and given 10mg  nubain iv push once.

## 2012-08-29 NOTE — Progress Notes (Signed)
Dovey Marcil is a 31 y.o. G2P1001 at [redacted]w[redacted]d by LMP admitted for active labor  Subjective:   Objective: BP 122/73  Pulse 91  Temp 99.9 F (37.7 C) (Oral)  Resp 20  Ht 4\' 11"  (1.499 m)  Wt 58.06 kg (128 lb)  BMI 25.85 kg/m2  SpO2 98%  LMP 12/05/2011   Total I/O In: -  Out: 250 [Urine:250]  FHT:  FHR: 150-160 bpm, variability: moderate,  accelerations:  Present,  decelerations:  Absent UC:   regular, every 2-3 minutes SVE:   Dilation: 10 Effacement (%): 100 Station: +1;+2 Exam by:: Lorretta Harp RNC  Labs: Lab Results  Component Value Date   WBC 9.5 08/29/2012   HGB 10.6* 08/29/2012   HCT 33.4* 08/29/2012   MCV 70.5* 08/29/2012   PLT 325 08/29/2012    Assessment / Plan: Spontaneous labor, progressing normally  Labor: Progressing normally Preeclampsia:  n/a Fetal Wellbeing:  Category I Pain Control:  Epidural I/D:  n/a Anticipated MOD:  NSVD  HARPER,CHARLES A 08/29/2012, 12:26 PM

## 2012-08-29 NOTE — Anesthesia Preprocedure Evaluation (Signed)

## 2012-08-29 NOTE — Progress Notes (Signed)
Alexandra Lynn is a 31 y.o. G2P1001 at [redacted]w[redacted]d by LMP admitted for active labor  Subjective:   Objective: BP 126/71  Pulse 83  Temp 97.4 F (36.3 C) (Oral)  Resp 20  Ht 4\' 11"  (1.499 m)  Wt 58.174 kg (128 lb 4 oz)  BMI 25.90 kg/m2  SpO2 100%  LMP 12/05/2011      FHT:  FHR: 150-160 bpm, variability: moderate,  accelerations:  Present,  decelerations:  Absent UC:   regular, every 3 minutes SVE:   Dilation: 7 Effacement (%): 90 Station: 0 Exam by:: Everrett Coombe RN  Labs: Lab Results  Component Value Date   WBC 9.5 08/29/2012   HGB 10.6* 08/29/2012   HCT 33.4* 08/29/2012   MCV 70.5* 08/29/2012   PLT 325 08/29/2012    Assessment / Plan: Spontaneous labor, progressing normally  Labor: Progressing normally Preeclampsia:  n/a Fetal Wellbeing:  Category I Pain Control:  Epidural I/D:  n/a Anticipated MOD:  NSVD  HARPER,CHARLES A 08/29/2012, 8:26 AM

## 2012-08-30 LAB — CBC
HCT: 27.9 % — ABNORMAL LOW (ref 36.0–46.0)
MCH: 21.6 pg — ABNORMAL LOW (ref 26.0–34.0)
MCHC: 31.2 g/dL (ref 30.0–36.0)
RDW: 15.3 % (ref 11.5–15.5)

## 2012-08-30 NOTE — Progress Notes (Signed)
Post Partum Day 1 Subjective: no complaints  Objective: Blood pressure 98/64, pulse 71, temperature 98 F (36.7 C), temperature source Oral, resp. rate 20, height 4\' 11"  (1.499 m), weight 58.06 kg (128 lb), last menstrual period 12/05/2011, SpO2 98.00%, unknown if currently breastfeeding.  Physical Exam:  General: alert and no distress Lochia: appropriate Uterine Fundus: firm Incision: none DVT Evaluation: No evidence of DVT seen on physical exam.   Basename 08/30/12 0546 08/29/12 0305  HGB 8.7* 10.6*  HCT 27.9* 33.4*    Assessment/Plan: Plan for discharge tomorrow.  Anemia.  Clinically stable.  Will Rx iron.   LOS: 1 day   HARPER,CHARLES A 08/30/2012, 8:34 AM

## 2012-08-30 NOTE — Anesthesia Postprocedure Evaluation (Signed)
  Anesthesia Post-op Note  Patient: Alexandra Lynn  Procedure(s) Performed: * No procedures listed *  Patient Location: Mother/Baby  Anesthesia Type:Epidural  Level of Consciousness: awake, alert  and oriented  Airway and Oxygen Therapy: Patient Spontanous Breathing  Post-op Pain: mild  Post-op Assessment: Patient's Cardiovascular Status Stable, Respiratory Function Stable, Patent Airway, No signs of Nausea or vomiting and Pain level controlled  Post-op Vital Signs: stable  Complications: No apparent anesthesia complications

## 2012-08-31 MED ORDER — OXYCODONE-ACETAMINOPHEN 5-325 MG PO TABS: 1.0000 | ORAL_TABLET | ORAL | Status: DC | PRN

## 2012-08-31 MED ORDER — IBUPROFEN 600 MG PO TABS: 600.0000 mg | ORAL_TABLET | Freq: Four times a day (QID) | ORAL | Status: DC | PRN

## 2012-08-31 MED ORDER — FUSION PLUS PO CAPS: 1.0000 | ORAL_CAPSULE | Freq: Every day | ORAL | Status: DC

## 2012-08-31 NOTE — Progress Notes (Signed)
Post discharge review completed. 

## 2012-08-31 NOTE — Progress Notes (Signed)
Post Part`um Day 2 Subjective: no complaints  Objective: Blood pressure 98/62, pulse 74, temperature 98 F (36.7 C), temperature source Oral, resp. rate 18, height 4\' 11"  (1.499 m), weight 128 lb (58.06 kg), last menstrual period 12/05/2011, SpO2 98.00%, unknown if currently breastfeeding.  Physical Exam:  General: alert and no distress Lochia: appropriate Uterine Fundus: firm Incision: none DVT Evaluation: No evidence of DVT seen on physical exam.   Basename 08/30/12 0546 08/29/12 0305  HGB 8.7* 10.6*  HCT 27.9* 33.4*    Assessment/Plan: Discharge home   LOS: 2 days   HARPER,CHARLES A 08/31/2012, 6:50 AM

## 2012-08-31 NOTE — Discharge Summary (Signed)
Obstetric Discharge Summary Reason for Admission: onset of labor Prenatal Procedures: ultrasound Intrapartum Procedures: spontaneous vaginal delivery Postpartum Procedures: none Complications-Operative and Postpartum: none Hemoglobin  Date Value Range Status  08/30/2012 8.7* 12.0 - 15.0 g/dL Final     DELTA CHECK NOTED     REPEATED TO VERIFY     HCT  Date Value Range Status  08/30/2012 27.9* 36.0 - 46.0 % Final    Physical Exam:  General: alert and no distress Lochia: appropriate Uterine Fundus: firm Incision: none DVT Evaluation: No evidence of DVT seen on physical exam.  Discharge Diagnoses: Term Pregnancy-delivered  Discharge Information: Date: 08/31/2012 Activity: pelvic rest Diet: routine Medications: PNV, Ibuprofen, Colace, Iron and Percocet Condition: stable Instructions: refer to practice specific booklet Discharge to: home Follow-up Information    Follow up with Antionette Char A, MD. Schedule an appointment as soon as possible for a visit in 6 weeks.   Contact information:   [address] [place] [address] [phone]          Newborn Data: Live born female  Birth Weight: 7 lb 10.6 oz (3475 g) APGAR: 8, 9  Home with mother.  HARPER,CHARLES A 08/31/2012, 6:56 AM

## 2013-04-29 ENCOUNTER — Ambulatory Visit (INDEPENDENT_AMBULATORY_CARE_PROVIDER_SITE_OTHER): Payer: Medicaid Other | Admitting: Obstetrics & Gynecology

## 2013-04-29 ENCOUNTER — Other Ambulatory Visit: Payer: Self-pay | Admitting: Obstetrics & Gynecology

## 2013-04-29 ENCOUNTER — Ambulatory Visit
Admission: RE | Admit: 2013-04-29 | Discharge: 2013-04-29 | Disposition: A | Discharge status: Home / Self Care | Payer: Medicaid Other | Source: Ambulatory Visit | Attending: Obstetrics & Gynecology | Admitting: Obstetrics & Gynecology

## 2013-04-29 ENCOUNTER — Telehealth (INDEPENDENT_AMBULATORY_CARE_PROVIDER_SITE_OTHER): Payer: Self-pay

## 2013-04-29 ENCOUNTER — Encounter: Payer: Self-pay | Admitting: Obstetrics & Gynecology

## 2013-04-29 VITALS — BP 122/83 | HR 96 | Temp 98.3°F | Wt 103.4 lb

## 2013-04-29 DIAGNOSIS — N6002 Solitary cyst of left breast: Secondary | ICD-10-CM

## 2013-04-29 DIAGNOSIS — N61 Mastitis without abscess: Secondary | ICD-10-CM

## 2013-04-29 MED ORDER — TRAMADOL HCL 50 MG PO TABS: 50.0000 mg | ORAL_TABLET | Freq: Four times a day (QID) | ORAL | Status: DC | PRN

## 2013-04-29 MED ORDER — AMOXICILLIN-POT CLAVULANATE 875-125 MG PO TABS: 1.0000 | ORAL_TABLET | Freq: Two times a day (BID) | ORAL | Status: DC

## 2013-04-29 NOTE — Patient Instructions (Signed)
Mastitis   Mastitis is a breast infection that is results in pain, puffiness (swelling), redness, and warmth of the breast. Germs cause mastitis and can enter the skin through:   Breastfeeding.   Nipple piercing.   Cracks in the skin of the breast.  HOME CARE   Take all medicine as told by your doctor. An antibiotic medicine to kill the infection may be prescribed.   Keep your nipples clean and dry if you breastfeed. You may have you stop breastfeeding until the breast infection has gone away.   Do not use one breast to nurse your baby. Switch breasts when you breastfeed. Use different positions to breastfeed.   Avoid letting your breasts get overly filled with milk (engorged). Use a breast pump to empty your breasts.   Do not wear tight-fitting bras. Wear a good support bra.   A breastfeeding specialist (lactation consultant) can give you helpful tips on breastfeeding.  GET HELP RIGHT AWAY IF:   Your breast starts leaking a yellow or tan fluid.   The pain, puffiness, or redness in your breast gets worse.   You have a fever.  MAKE SURE YOU:    Understand these instructions.   Will watch your condition.   Will get help right away if you are not doing well or get worse.  Document Released: 09/18/2009 Document Revised: 12/23/2011 Document Reviewed: 09/18/2009  [employer] Patient Information 2014 [employer].

## 2013-04-29 NOTE — Progress Notes (Signed)
.   Subjective:     Alexandra Lynn is a 32 y.o. female here for a problem exam.  Current complaints: Left breast pain for one month.  Patient states that she has a history of breast infections. Denies trauma, piercing.   Personal health questionnaire reviewed: no.   Gynecologic History No LMP recorded. Contraception: none  Obstetric History OB History   Grav Para Term Preterm Abortions TAB SAB Ect Mult Living   2 2 2  0 0 0 0 0 0 2     # Outc Date GA Lbr Len/2nd Wgt Sex Del Anes PTL Lv   1 TRM 11/13 [redacted]w[redacted]d 11:00 / 02:42 7lb10.6oz(3.475kg) F SVD EPI  Yes   Comments: none   2 TRM                The following portions of the patient's history were reviewed and updated as appropriate: allergies, current medications, past family history, past medical history, past social history, past surgical history and problem list.  Review of Systems Pertinent items are noted in HPI.    Objective:   Breast: left breast with subareolar fullness, overlying skin erythema, very tender; scar at 12 o'clock Axilla: no palpable lymphadenopathy on the left  Assessment:   Nonpuerperal mastitis--?abscess H/O bilateral breast abscesses  Plan:   Breast U/S Antibiotics/supportive measures Referral-->General surgery

## 2013-04-29 NOTE — Progress Notes (Signed)
  Subjective:    Patient ID: Alexandra Lynn, female    DOB: 07/08/1981, 32 y.o.   MRN: [id number]  HPI    Review of Systems     Objective:   Physical Exam  Pulmonary/Chest:            Assessment & Plan:

## 2013-04-29 NOTE — Telephone Encounter (Signed)
Received call from Scarlette Calico at Dr [facility] Rosenthal [facility] office re:pt having mastitis and requesting office visit now. Reviewed with Dr Luisa Hart in urgent office. Per his request Scarlette Calico advised pt needs U/S of breast at [facility] to assess if there is an abscess that can be aspirated or drained and place pt on antibiotic. She will review this with Dr Christell Constant and call back if surgical intervention is needed.

## 2013-05-05 ENCOUNTER — Encounter: Payer: Self-pay | Admitting: Obstetrics & Gynecology

## 2013-07-05 ENCOUNTER — Ambulatory Visit (INDEPENDENT_AMBULATORY_CARE_PROVIDER_SITE_OTHER): Payer: Medicaid Other | Admitting: Obstetrics & Gynecology

## 2013-07-05 ENCOUNTER — Encounter: Payer: Self-pay | Admitting: Obstetrics & Gynecology

## 2013-07-05 VITALS — BP 105/75 | HR 81 | Temp 98.2°F | Wt 105.0 lb

## 2013-07-05 DIAGNOSIS — Z3202 Encounter for pregnancy test, result negative: Secondary | ICD-10-CM

## 2013-07-05 DIAGNOSIS — N61 Mastitis without abscess: Secondary | ICD-10-CM

## 2013-07-05 DIAGNOSIS — Z23 Encounter for immunization: Secondary | ICD-10-CM

## 2013-07-05 MED ORDER — DICLOFENAC EPOLAMINE 1.3 % TD PTCH: 1.0000 | MEDICATED_PATCH | Freq: Two times a day (BID) | TRANSDERMAL | Status: DC | PRN

## 2013-07-05 NOTE — Progress Notes (Signed)
Subjective:     Alexandra Lynn is a 32 y.o. female here for a routine exam.  Current complaints: follow up for breast pain. Pt states she is still having pain in her left breast. Pt states she has taken the antibiotic that was given. Pt has not been to see the general surgeon.  Personal health questionnaire reviewed: yes.   Gynecologic History Patient's last menstrual period was 06/18/2013. Contraception: none Last mammogram: 04/2013 Results were: left breast cyst  Obstetric History OB History  Gravida Para Term Preterm AB SAB TAB Ectopic Multiple Living  2 2 2  0 0 0 0 0 0 2    # Outcome Date GA Lbr Len/2nd Weight Sex Delivery Anes PTL Lv  2 TRM 08/29/12 [redacted]w[redacted]d 11:00 / 02:42 7 lb 10.6 oz (3.475 kg) F SVD EPI  Y     Comments: none  1 TRM                The following portions of the patient's history were reviewed and updated as appropriate: allergies, current medications, past family history, past medical history, past social history, past surgical history and problem list.  Review of Systems Pertinent items are noted in HPI.    Objective:      General:  alert     Abdomen: soft, non-tender; bowel sounds normal; no masses,  no organomegaly   Vulva:  normal  Vagina: normal vagina  Cervix:  no lesions  Corpus: normal size, contour, position, consistency, mobility, non-tender  Adnexa:  normal adnexa      Pelvic U/S, informal wnl  Assessment:   Fibroadenoma--right breast S/P drainage L breast abscess w/persistent pain Vague abdominal pain--?etiology  Plan:   Orders Placed This Encounter  Procedures  . Urine culture  . WET PREP BY MOLECULAR PROBE  . GC/Chlamydia Probe Amp  . Flu vaccine greater than or equal to 3yo with preservative IM  . POCT urine pregnancy    Diclofenac patch prn breast pain Return after formal U/S

## 2013-07-06 ENCOUNTER — Encounter: Payer: Self-pay | Admitting: Obstetrics & Gynecology

## 2013-07-06 LAB — WET PREP BY MOLECULAR PROBE
Candida species: NEGATIVE
Gardnerella vaginalis: NEGATIVE
Trichomonas vaginosis: NEGATIVE

## 2013-07-06 LAB — GC/CHLAMYDIA PROBE AMP: CT Probe RNA: NEGATIVE

## 2013-07-06 LAB — URINE CULTURE: Organism ID, Bacteria: NO GROWTH

## 2013-07-06 NOTE — Patient Instructions (Signed)
Pelvic Pain, Female Female pelvic pain can be caused by many different things and start from a variety of places. Pelvic pain refers to pain that is located in the lower half of the abdomen and between your hips. The pain may occur over a short period of time (acute) or may be reoccurring (chronic). The cause of pelvic pain may be related to disorders affecting the female reproductive organs (gynecologic), but it may also be related to the bladder, kidney stones, an intestinal complication, or muscle or skeletal problems. Getting help right away for pelvic pain is important, especially if there has been severe, sharp, or a sudden onset of unusual pain. It is also important to get help right away because some types of pelvic pain can be life threatening.  CAUSES  Below are only some of the causes of pelvic pain. The causes of pelvic pain can be in one of several categories.   Gynecologic.  Pelvic inflammatory disease.  Sexually transmitted infection.  Ovarian cyst or a twisted ovarian ligament (ovarian torsion).  Uterine lining that grows outside the uterus (endometriosis).  Fibroids, cysts, or tumors.  Ovulation.  Pregnancy.  Pregnancy that occurs outside the uterus (ectopic pregnancy).  Miscarriage.  Labor.  Abruption of the placenta or ruptured uterus.  Infection.  Uterine infection (endometritis).  Bladder infection.  Diverticulitis.  Miscarriage related to a uterine infection (septic abortion).  Bladder.  Inflammation of the bladder (cystitis).  Kidney stone(s).  Gastrointenstinal.  Constipation.  Diverticulitis.  Neurologic.  Trauma.  Feeling pelvic pain because of mental or emotional causes (psychosomatic).  Cancers of the bowel or pelvis. EVALUATION  Your caregiver will want to take a careful history of your concerns. This includes recent changes in your health, a careful gynecologic history of your periods (menses), and a sexual history. Obtaining  your family history and medical history is also important. Your caregiver may suggest a pelvic exam. A pelvic exam will help identify the location and severity of the pain. It also helps in the evaluation of which organ system may be involved. In order to identify the cause of the pelvic pain and be properly treated, your caregiver may order tests. These tests may include:   A pregnancy test.  Pelvic ultrasonography.  An X-ray exam of the abdomen.  A urinalysis or evaluation of vaginal discharge.  Blood tests. HOME CARE INSTRUCTIONS   Only take over-the-counter or prescription medicines for pain, discomfort, or fever as directed by your caregiver.   Rest as directed by your caregiver.   Eat a balanced diet.   Drink enough fluids to make your urine clear or pale yellow, or as directed.   Avoid sexual intercourse if it causes pain.   Apply warm or cold compresses to the lower abdomen depending on which one helps the pain.   Avoid stressful situations.   Keep a journal of your pelvic pain. Write down when it started, where the pain is located, and if there are things that seem to be associated with the pain, such as food or your menstrual cycle.  Follow up with your caregiver as directed.  SEEK MEDICAL CARE IF:  Your medicine does not help your pain.  You have abnormal vaginal discharge. SEEK IMMEDIATE MEDICAL CARE IF:   You have heavy bleeding from the vagina.   Your pelvic pain increases.   You feel lightheaded or faint.   You have chills.   You have pain with urination or blood in your urine.   You have uncontrolled  diarrhea or vomiting.   You have a fever or persistent symptoms for more than 3 days.  You have a fever and your symptoms suddenly get worse.   You are being physically or sexually abused.  MAKE SURE YOU:  Understand these instructions.  Will watch your condition.  Will get help if you are not doing well or get worse. Document  Released: 08/27/2004 Document Revised: 03/31/2012 Document Reviewed: 01/20/2012 [employer] Patient Information 2014 [place], [place]. Breast Tenderness Breast tenderness is a common complaint made by women of all ages. It is also called mastalgia or mastodynia, which means breast pain. The condition can range from mild discomfort to severe pain. It has a variety of causes. Your caregiver will find out the likely cause of your breast tenderness by examining your breasts, asking you about symptoms and perhaps ordering some tests. Breast tenderness usually does not mean you have breast cancer. CAUSES  Breast tenderness has many possible causes. They include:  Premenstrual changes. A week to 10 days before your period, your breasts might ache or feel tender.  Other hormonal causes. These include:  When sexual and physical traits mature (puberty).  Pregnancy.  The time right before and the year after menopause (perimenopause).  The day when it has been 12 months since your last period (menopause).  Large breasts.  Infection (also called mastitis).  Birth control pills.  Breastfeeding. Tenderness can occur if the breasts are overfull with milk or if a milk duct is blocked.  Injury.  Fibrocystic breast changes. This is not cancer (benign). It causes painful breasts that feel lumpy.  Fluid-filled sacs (cysts). Often cysts can be drained in your healthcare provider's office.  Fibroadenoma. This is a tumor that is not cancerous.  Medication side effects. Blood pressure drugs and diuretics (which increase urine flow) sometimes cause breast tenderness.  Previous breast surgery, such as a breast reduction.  Breast cancer. Cancer is rarely the reason breasts are tender. In most women, tenderness is caused by something else. DIAGNOSIS  Several methods can be used to find out why your breasts are tender. They include:  Visual inspection of the breasts.  Examination by hand.  Tests, such  as:  Mammogram.  Ultrasound.  Biopsy.  Lab test of any fluid coming from the nipple.  Blood tests.  MRI. TREATMENT  Treatment is directed to the cause of the breast tenderness from doing nothing for minor discomfort, wearing a good support bra but also may include:  Taking over-the-counter medicines for pain or discomfort as directed by your caregiver.  Prescription medicine for breast tenderness related to:  Premenstrual.  Fibrocystic.  Puberty.  Pregnancy.  Menopause.  Previous breast surgery.  Large breasts.  Antibiotics for infection.  Birth control pills for fibrocystic and premenstrual changes.  More frequent feedings or pumping of the breasts and warm compresses for breast engorgement when nursing.  Cold and warm compresses and a good support bra for most breast injuries.  Breast cysts are sometimes drained with a needle (aspiration) or removed with minor surgery.  Fibroadenomas are usually removed with minor surgery.  Changing or stopping the medicine when it is responsible for causing the breast tenderness.  When breast cancer is present with or without causing pain, it is usually treated with major surgery (with or without radiation) and chemotherapy. HOME CARE INSTRUCTIONS  Breast tenderness often can be handled at home. You can try:  Getting fitted for a new bra that provides more support, especially during exercise.  Wearing a more  supportive or sports bra while sleeping when your breasts are very tender.  If you have a breast injury, using an ice pack for 15 to 20 minutes. Wrap the pack in a towel. Do not put the ice pack directly on your breast.  If your breasts are too full of milk as a result of breastfeeding, try:  Expressing milk either by hand or with a breast pump.  Applying a warm compress for relief.  Taking over-the-counter pain relievers, if this is OK with your caregiver.  Taking medicine that your caregiver prescribes. These  might include antibiotics or birth control pills. Over the long term, your breast tenderness might be eased if you:  Cut down on caffeine.  Reduce the amount of fat in your diet. Also, learn how to do breast examinations at home. This will help you tell when you have an unusual growth or lump that could cause tenderness. And keep a log of the days and times when your breasts are most tender. This will help you and your caregiver find the right solution. SEEK MEDICAL CARE IF:   Any part of your breast is hard, red and hot to the touch. This could be a sign of infection.  Fluid is coming out of your nipples (and you are not breastfeeding). Especially watch for blood or pus.  You have a fever as well as breast tenderness.  You have a new or painful lump in your breast that remains after your period ends.  You have tried to take care of the pain at home, but it has not gone away.  Your breast pain is getting worse. Or, the pain is making it hard to do the things you usually do during your day. Document Released: 09/12/2008 Document Revised: 12/23/2011 Document Reviewed: 09/12/2008 [facility] [place] [facility] Patient Information 2014 [place], [place].

## 2013-07-06 NOTE — Progress Notes (Signed)
  Subjective:    Patient ID: Alexandra Lynn, female    DOB: 18-Sep-1981, 32 y.o.   MRN: [id number]  HPI    Review of Systems     Objective:   Physical Exam  Pulmonary/Chest:            Assessment & Plan:

## 2013-07-20 ENCOUNTER — Other Ambulatory Visit: Payer: Self-pay | Admitting: *Deleted

## 2013-07-20 ENCOUNTER — Other Ambulatory Visit (INDEPENDENT_AMBULATORY_CARE_PROVIDER_SITE_OTHER): Payer: Medicaid Other

## 2013-07-20 ENCOUNTER — Other Ambulatory Visit: Payer: Self-pay | Admitting: Obstetrics & Gynecology

## 2013-07-20 DIAGNOSIS — N949 Unspecified condition associated with female genital organs and menstrual cycle: Secondary | ICD-10-CM

## 2013-07-21 ENCOUNTER — Encounter: Payer: Self-pay | Admitting: Obstetrics & Gynecology

## 2013-07-21 ENCOUNTER — Ambulatory Visit (INDEPENDENT_AMBULATORY_CARE_PROVIDER_SITE_OTHER): Payer: Medicaid Other | Admitting: Obstetrics & Gynecology

## 2013-07-21 VITALS — BP 111/74 | HR 83 | Temp 98.2°F | Wt 104.4 lb

## 2013-07-21 DIAGNOSIS — R102 Pelvic and perineal pain: Secondary | ICD-10-CM

## 2013-07-21 DIAGNOSIS — N644 Mastodynia: Secondary | ICD-10-CM

## 2013-07-21 NOTE — Progress Notes (Signed)
.   Subjective:     Alexandra Lynn is a 31 y.o. female here for her ultrasound results - done 07/20/13.  Personal health questionnaire reviewed: no.   Gynecologic History Patient's last menstrual period was 07/17/2013.    Obstetric History OB History  Gravida Para Term Preterm AB SAB TAB Ectopic Multiple Living  2 2 2  0 0 0 0 0 0 2    # Outcome Date GA Lbr Len/2nd Weight Sex Delivery Anes PTL Lv  2 TRM 08/29/12 [redacted]w[redacted]d 11:00 / 02:42 7 lb 10.6 oz (3.475 kg) F SVD EPI  Y     Comments: none  1 TRM                The following portions of the patient's history were reviewed and updated as appropriate: allergies, current medications, past family history, past medical history, past social history, past surgical history and problem list.  Review of Systems Pertinent items are noted in HPI.    Objective:     Breast exam without change     Assessment:   Fibroadenoma--right breast Mastodynia left breast improved Pelvic pain resolved--normal U/S findings reviewed  Plan:  Return prn

## 2014-02-04 ENCOUNTER — Other Ambulatory Visit: Payer: Self-pay | Admitting: Obstetrics & Gynecology

## 2014-02-04 ENCOUNTER — Ambulatory Visit (INDEPENDENT_AMBULATORY_CARE_PROVIDER_SITE_OTHER): Payer: Medicaid Other | Admitting: Obstetrics & Gynecology

## 2014-02-04 ENCOUNTER — Encounter: Payer: Self-pay | Admitting: Obstetrics & Gynecology

## 2014-02-04 VITALS — BP 109/79 | HR 92 | Temp 99.0°F | Ht 61.0 in | Wt 100.0 lb

## 2014-02-04 DIAGNOSIS — R509 Fever, unspecified: Secondary | ICD-10-CM

## 2014-02-04 DIAGNOSIS — N644 Mastodynia: Secondary | ICD-10-CM

## 2014-02-04 DIAGNOSIS — N61 Mastitis without abscess: Secondary | ICD-10-CM

## 2014-02-04 LAB — CBC WITH DIFFERENTIAL/PLATELET
Basophils Absolute: 0 10*3/uL (ref 0.0–0.1)
Basophils Relative: 0 % (ref 0–1)
Eosinophils Absolute: 0.3 10*3/uL (ref 0.0–0.7)
Eosinophils Relative: 3 % (ref 0–5)
HEMATOCRIT: 42.3 % (ref 36.0–46.0)
HEMOGLOBIN: 14.1 g/dL (ref 12.0–15.0)
LYMPHS ABS: 2.4 10*3/uL (ref 0.7–4.0)
LYMPHS PCT: 24 % (ref 12–46)
MCH: 25.7 pg — ABNORMAL LOW (ref 26.0–34.0)
MCHC: 33.3 g/dL (ref 30.0–36.0)
MCV: 77.2 fL — AB (ref 78.0–100.0)
MONO ABS: 0.8 10*3/uL (ref 0.1–1.0)
Monocytes Relative: 8 % (ref 3–12)
Neutro Abs: 6.4 10*3/uL (ref 1.7–7.7)
Neutrophils Relative %: 65 % (ref 43–77)
Platelets: 377 10*3/uL (ref 150–400)
RBC: 5.48 MIL/uL — AB (ref 3.87–5.11)
RDW: 13.6 % (ref 11.5–15.5)
WBC: 9.8 10*3/uL (ref 4.0–10.5)

## 2014-02-04 MED ORDER — AMOXICILLIN-POT CLAVULANATE 875-125 MG PO TABS: 1.0000 | ORAL_TABLET | Freq: Two times a day (BID) | ORAL | Status: AC

## 2014-02-04 NOTE — Progress Notes (Signed)
Patient ID: Brand Males, female   DOB: 12/08/1980, 33 y.o.   MRN: [id number]  Chief Complaint  Patient presents with  . Breast Problem    HPI Alexandra Lynn is a 33 y.o. female.   Female GU Problem Primary symptoms comment: left breast pain. Chronicity: h/o abscess drained 7/14. The current episode started 1 to 4 weeks ago. The problem occurs constantly. The problem has been gradually worsening. The pain is moderate. The problem affects the left side. Associated symptoms include a fever. Associated symptoms comments: White nipple discharge manipulation.    Past Medical History  Diagnosis Date  . Ovarian cyst 2011    Past Surgical History  Procedure Laterality Date  . Laproscopic  2009    removal of fluid from ovary    Family History  Problem Relation Age of Onset  . Hearing loss Neg Hx   . Anesthesia problems Neg Hx     Social History History  Substance Use Topics  . Smoking status: Never Smoker   . Smokeless tobacco: Never Used  . Alcohol Use: No    No Known Allergies  No current outpatient prescriptions on file.   No current facility-administered medications for this visit.    Review of Systems Review of Systems  Constitutional: Positive for fever.   Constitutional: negative for fatigue and weight loss Respiratory: negative for cough and wheezing Cardiovascular: negative for chest pain, fatigue and palpitations Gastrointestinal: negative for abdominal pain and change in bowel habits Genitourinary:negative Integument/breast: negative for nipple discharge Musculoskeletal:negative for myalgias Neurological: negative for gait problems and tremors Behavioral/Psych: negative for abusive relationship, depression Endocrine: negative for temperature intolerance     Blood pressure 109/79, pulse 92, temperature 99 F (37.2 C), height 5\' 1"  (1.549 m), weight 45.36 kg (100 lb), last menstrual period 01/05/2014.  Physical Exam Physical Exam  Pulmonary/Chest: Right  breast exhibits inverted nipple and mass. Left breast exhibits inverted nipple. Left breast exhibits no nipple discharge. Skin change: erythema.     General:   alert  Skin:   no rash or abnormalities  Lungs:   clear to auscultation bilaterally  Heart:   regular rate and rhythm, S1, S2 normal, no murmur, click, rub or gallop  Breasts:  See above  Abdomen:  normal findings: no organomegaly, soft, non-tender and no hernia      Data Reviewed Previous breast U/S  Assessment  Nonpuerperal mastitis; ?Chronic/recurrent     Plan    Meds ordered this encounter  Medications  . amoxicillin-clavulanate (AUGMENTIN) 875-125 MG per tablet    Sig: Take 1 tablet by mouth 2 (two) times daily.    Dispense:  20 tablet    Refill:  0   Orders Placed This Encounter  Procedures  . MM Digital Diagnostic Unilat L    Standing Status: Future     Number of Occurrences:      Standing Expiration Date: 04/07/2015    Order Specific Question:  Reason for Exam (SYMPTOM  OR DIAGNOSIS REQUIRED)    Answer:  Left breast pain/possible abcess    Order Specific Question:  Is the patient pregnant?    Answer:  No    Order Specific Question:  Preferred imaging location?    Answer:  External     Comments:  [facility]  . CBC with Differential    Follow up as needed.         Alexandra Lynn 02/04/2014, 3:41 PM

## 2014-02-04 NOTE — Patient Instructions (Signed)
Mastitis   Mastitis is inflammation of the breast tissue. It occurs most often in women who are breastfeeding, but it can also affect other women, and even sometimes men.  CAUSES   Mastitis is usually caused by a bacterial infection. Bacteria enter the breast tissue through cuts or openings in the skin. Typically, this occurs with breastfeeding because of cracked or irritated skin. Sometimes, it can occur even when there is no opening in the skin. It can be associated with plugged milk (lactiferous) ducts. Nipple piercing can also lead to mastitis. Also, some forms of breast cancer can cause mastitis.  SIGNS AND SYMPTOMS   · Swelling, redness, tenderness, and pain in an area of the breast.  · Swelling of the glands under the arm on the same side.  · Fever.  If an infection is allowed to progress, a collection of pus (abscess) may develop.  DIAGNOSIS   Your health care provider can usually diagnose mastitis based on your symptoms and a physical exam. Tests may be done to help confirm the diagnosis. These may include:   · Removal of pus from the breast by applying pressure to the area. This pus can be examined in the lab to determine which bacteria are present. If an abscess has developed, the fluid in the abscess can be removed with a needle. This can also be used to confirm the diagnosis and determine the bacteria present. In most cases, pus will not be present.  · Blood tests to determine if your body is fighting a bacterial infection.  · Mammogram or ultrasound tests to rule out other problems or diseases.  TREATMENT   Antibiotic medicine is used to treat a bacterial infection. Your health care provider will determine which bacteria are most likely causing the infection and will select an appropriate antibiotic. This is sometimes changed based on the results of tests performed to identify the bacteria, or if there is no response to the antibiotic selected. Antibiotics are usually given by mouth. You may also be  given medicine for pain.  Mastitis that occurs with breastfeeding will sometimes go away on its own, so your health care provider may choose to wait 24 hours after first seeing you to decide whether a prescription medicine is needed.  HOME CARE INSTRUCTIONS   · Only take over-the-counter or prescription medicines for pain, fever, or discomfort as directed by your health care provider.  · If your health care provider prescribed an antibiotic, take the medicine as directed. Make sure you finish it even if you start to feel better.  · Do not wear a tight or underwire bra. Wear a soft, supportive bra.  · Increase your fluid intake, especially if you have a fever.  · Women who are breastfeeding should follow these instructions:  · Continue to empty the breast. Your health care provider can tell you whether this milk is safe for your infant or needs to be thrown out. You may be told to stop nursing until your health care provider thinks it is safe for your baby. Use a breast pump if you are advised to stop nursing.  · Keep your nipples clean and dry.  · Empty the first breast completely before going to the other breast. If your baby is not emptying your breasts completely for some reason, use a breast pump to empty your breasts.  · If you go back to work, pump your breasts while at work to stay in time with your nursing schedule.  · Avoid   allowing your breasts to become overly filled with milk (engorged).  SEEK MEDICAL CARE IF:   · You have pus-like discharge from the breast.  · Your symptoms do not improve with the treatment prescribed by your health care provider within 2 days.  SEEK IMMEDIATE MEDICAL CARE IF:   · Your pain and swelling are getting worse.  · You have pain that is not controlled with medicine.  · You have a red line extending from the breast toward your armpit.  · You have a fever or persistent symptoms for more than 2 3 days.  · You have a fever and your symptoms suddenly get worse.  Document Released:  09/30/2005 Document Revised: 07/21/2013 Document Reviewed: 04/30/2013  Exit[employer]® Patient Information ©2014 [employer].

## 2014-02-07 ENCOUNTER — Ambulatory Visit (INDEPENDENT_AMBULATORY_CARE_PROVIDER_SITE_OTHER): Payer: Medicaid Other | Admitting: General Surgery

## 2014-02-07 ENCOUNTER — Encounter: Payer: Self-pay | Admitting: Obstetrics & Gynecology

## 2014-02-07 ENCOUNTER — Ambulatory Visit (INDEPENDENT_AMBULATORY_CARE_PROVIDER_SITE_OTHER): Payer: Medicaid Other | Admitting: Obstetrics & Gynecology

## 2014-02-07 VITALS — BP 103/69 | HR 75 | Temp 98.3°F | Wt 100.0 lb

## 2014-02-07 DIAGNOSIS — N61 Mastitis without abscess: Secondary | ICD-10-CM

## 2014-02-07 DIAGNOSIS — N611 Abscess of the breast and nipple: Secondary | ICD-10-CM

## 2014-02-07 NOTE — Progress Notes (Signed)
Patient ID: Alexandra Lynn, female   DOB: 04-13-1981, 33 y.o.   MRN: [id number]  Chief Complaint  Patient presents with  . Breast Problem    abcess left breast    HPI Alexandra Lynn is a 33 y.o. female.   Female GU Problem Primary symptoms comment: left breast pain. Chronicity: h/o abscess drained 7/14. The current episode started 1 to 4 weeks ago. The problem occurs constantly. The problem has been gradually worsening. The pain is moderate. The problem affects the left side. Associated symptoms include a fever. Associated symptoms comments: White nipple discharge manipulation.  The patient did not get prescription filled for the antibiotics  Past Medical History  Diagnosis Date  . Ovarian cyst 2011    Past Surgical History  Procedure Laterality Date  . Laproscopic  2009    removal of fluid from ovary    Family History  Problem Relation Age of Onset  . Hearing loss Neg Hx   . Anesthesia problems Neg Hx     Social History History  Substance Use Topics  . Smoking status: Never Smoker   . Smokeless tobacco: Never Used  . Alcohol Use: No    No Known Allergies  Current Outpatient Prescriptions  Medication Sig Dispense Refill  . amoxicillin-clavulanate (AUGMENTIN) 875-125 MG per tablet Take 1 tablet by mouth 2 (two) times daily.  20 tablet  0   No current facility-administered medications for this visit.    Review of Systems Review of Systems  Constitutional: Positive for fever.   Constitutional: negative for fatigue and weight loss Respiratory: negative for cough and wheezing Cardiovascular: negative for chest pain, fatigue and palpitations Gastrointestinal: negative for abdominal pain and change in bowel habits Genitourinary:negative Integument/breast: negative for nipple discharge Musculoskeletal:negative for myalgias Neurological: negative for gait problems and tremors Behavioral/Psych: negative for abusive relationship, depression Endocrine: negative for  temperature intolerance     Blood pressure 103/69, pulse 75, temperature 98.3 F (36.8 C), weight 45.36 kg (100 lb), last menstrual period 01/04/2014.  Physical Exam Physical Exam  Pulmonary/Chest: Right breast exhibits inverted nipple and mass. Left breast exhibits inverted nipple. Left breast exhibits no nipple discharge. Skin change: less erythema.     General:   alert  Skin:   no rash or abnormalities  Lungs:   clear to auscultation bilaterally  Heart:   regular rate and rhythm, S1, S2 normal, no murmur, click, rub or gallop  Breasts:  See above  Abdomen:  normal findings: no organomegaly, soft, non-tender and no hernia      Data Reviewed Previous breast U/S  Assessment  Nonpuerperal mastitis; ?Chronic/recurrent     Plan    Encouraged compliance w/antiobiotic Keep f/u appointment at breast imaging Return prn or in 2 weeks  Follow up as needed.         Alexandra Lynn 02/07/2014, 5:23 PM

## 2014-02-11 ENCOUNTER — Ambulatory Visit
Admission: RE | Admit: 2014-02-11 | Discharge: 2014-02-11 | Disposition: A | Discharge status: Home / Self Care | Payer: Medicaid Other | Source: Ambulatory Visit | Attending: Obstetrics & Gynecology | Admitting: Obstetrics & Gynecology

## 2014-02-11 DIAGNOSIS — N644 Mastodynia: Secondary | ICD-10-CM

## 2014-02-24 ENCOUNTER — Ambulatory Visit (INDEPENDENT_AMBULATORY_CARE_PROVIDER_SITE_OTHER): Payer: Medicaid Other | Admitting: Obstetrics & Gynecology

## 2014-02-24 ENCOUNTER — Encounter: Payer: Self-pay | Admitting: Obstetrics & Gynecology

## 2014-02-24 VITALS — BP 107/69 | HR 81 | Temp 97.4°F | Ht 61.0 in | Wt 100.0 lb

## 2014-02-24 DIAGNOSIS — N61 Mastitis without abscess: Secondary | ICD-10-CM

## 2014-02-27 NOTE — Progress Notes (Signed)
Patient ID: Alexandra Lynn, female   DOB: [birth year], 33 y.o.   MRN: [id number]  Chief Complaint  Patient presents with  . Follow-up    Breast Abcess    HPI Alexandra Lynn is a 33 y.o. female.   Female GU Problem Primary symptoms comment: left breast pain. The problem has been gradually improving.    Past Medical History  Diagnosis Date  . Ovarian cyst 2011    Past Surgical History  Procedure Laterality Date  . Laproscopic  2009    removal of fluid from ovary    Family History  Problem Relation Age of Onset  . Hearing loss Neg Hx   . Anesthesia problems Neg Hx     Social History History  Substance Use Topics  . Smoking status: Never Smoker   . Smokeless tobacco: Never Used  . Alcohol Use: No    No Known Allergies  No current outpatient prescriptions on file.   No current facility-administered medications for this visit.    Review of Systems Review of Systems Constitutional: negative for fatigue and weight loss Respiratory: negative for cough and wheezing Cardiovascular: negative for chest pain, fatigue and palpitations Gastrointestinal: negative for abdominal pain and change in bowel habits Genitourinary:negative for vaginal discharge Integument/breast: negative for nipple discharge Musculoskeletal:negative for myalgias Neurological: negative for gait problems and tremors Behavioral/Psych: negative for abusive relationship, depression Endocrine: negative for temperature intolerance     Blood pressure 107/69, pulse 81, temperature 97.4 F (36.3 C), height 5\' 1"  (1.549 m), weight 45.36 kg (100 lb), last menstrual period 02/06/2014, not currently breastfeeding.  Physical Exam Physical Exam  Pulmonary/Chest:     General:   alert  Skin:   no rash or abnormalities  Lungs:   clear to auscultation bilaterally  Heart:   regular rate and rhythm, S1, S2 normal, no murmur, click, rub or gallop  Breasts:  See above  Abdomen:  normal findings: no organomegaly,  soft, non-tender and no hernia      Data Reviewed breast U/S  Assessment  Nonpuerperal mastitis; ?Chronic/recurrent--resolved    Plan    F/u with General Surgery Follow up as needed.         [name] 02/27/2014, 1:14 PM

## 2014-02-28 ENCOUNTER — Ambulatory Visit (INDEPENDENT_AMBULATORY_CARE_PROVIDER_SITE_OTHER): Payer: Medicaid Other | Admitting: General Surgery

## 2014-02-28 ENCOUNTER — Telehealth (INDEPENDENT_AMBULATORY_CARE_PROVIDER_SITE_OTHER): Payer: Self-pay | Admitting: *Deleted

## 2014-02-28 ENCOUNTER — Encounter (INDEPENDENT_AMBULATORY_CARE_PROVIDER_SITE_OTHER): Payer: Self-pay | Admitting: General Surgery

## 2014-02-28 VITALS — BP 98/64 | HR 80 | Temp 97.6°F | Resp 14 | Ht 61.0 in | Wt 100.0 lb

## 2014-02-28 DIAGNOSIS — N63 Unspecified lump in unspecified breast: Secondary | ICD-10-CM

## 2014-02-28 DIAGNOSIS — N6310 Unspecified lump in the right breast, unspecified quadrant: Secondary | ICD-10-CM | POA: Insufficient documentation

## 2014-02-28 DIAGNOSIS — N6320 Unspecified lump in the left breast, unspecified quadrant: Secondary | ICD-10-CM | POA: Insufficient documentation

## 2014-02-28 NOTE — Telephone Encounter (Signed)
Called the pt a couple of times.  They do not understand english and they hung up on me both times,  I need to let pt know she has an appt for [place] and Mammo of Right Breast scheduled at [facility] for 03-10-14 @ 9:00.  Thanks!  Anderson Malta

## 2014-02-28 NOTE — Telephone Encounter (Signed)
Tried calling pt several times to make her aware of her appts at the [facility] [phone] and noone could understand what I was saying.    I contacted the [facility] said that she would call the interpreter and have them call the pt.  Anderson Malta

## 2014-02-28 NOTE — Progress Notes (Signed)
Patient ID: Alexandra Lynn, female   DOB: 1981/09/07, 33 y.o.   MRN: [id number]  Chief Complaint  Patient presents with  . New Evaluation    eval left breast abscess    HPI Alexandra Lynn is a 33 y.o. female.  We're asked to see the patient in consultation by Dr. Delsa Sale to evaluate her for a left breast mass. The patient is a 33 year old Asian female who apparently presented recently with a red and swollen left breast. It sounds as though she was treated with antibiotics and the redness resolved. She does not complain of any pain in either breast. She had an ultrasound of her left breast which showed a probable cyst that was felt to be benign and smaller than on previous studies.  HPI  Past Medical History  Diagnosis Date  . Ovarian cyst 2011    Past Surgical History  Procedure Laterality Date  . Laproscopic  2009    removal of fluid from ovary    Family History  Problem Relation Age of Onset  . Hearing loss Neg Hx   . Anesthesia problems Neg Hx     Social History History  Substance Use Topics  . Smoking status: Never Smoker   . Smokeless tobacco: Never Used  . Alcohol Use: No    No Known Allergies  No current outpatient prescriptions on file.   No current facility-administered medications for this visit.    Review of Systems Review of Systems  Constitutional: Negative.   HENT: Negative.   Eyes: Negative.   Respiratory: Negative.   Cardiovascular: Negative.   Gastrointestinal: Negative.   Endocrine: Negative.   Genitourinary: Negative.   Musculoskeletal: Negative.   Skin: Negative.   Allergic/Immunologic: Negative.   Neurological: Negative.   Hematological: Negative.   Psychiatric/Behavioral: Negative.     Blood pressure 98/64, pulse 80, temperature 97.6 F (36.4 C), temperature source Temporal, resp. rate 14, height 5\' 1"  (1.549 m), weight 100 lb (45.36 kg), last menstrual period 02/06/2014, not currently breastfeeding.  Physical Exam Physical  Exam  Constitutional: She is oriented to person, place, and time. She appears well-developed and well-nourished.  HENT:  Head: Normocephalic and atraumatic.  Eyes: Conjunctivae and EOM are normal. Pupils are equal, round, and reactive to light.  Neck: Normal range of motion. Neck supple.  Cardiovascular: Normal rate, regular rhythm and normal heart sounds.   Pulmonary/Chest: Effort normal and breath sounds normal.  She has small and dense breast tissue bilaterally. On the left breast in the 11:00 position at the edge of the areola she has a palpable abnormality that feels like dense breast tissue at the site of a previous surgery. In the right breast 11:00 and 7:00 she has 2 very round mobile masses that feel like fibroadenomas.  Abdominal: Soft. Bowel sounds are normal.  Musculoskeletal: Normal range of motion.  Lymphadenopathy:    She has no cervical adenopathy.  Neurological: She is alert and oriented to person, place, and time.  Skin: Skin is warm and dry.  Psychiatric: She has a normal mood and affect. Her behavior is normal.    Data Reviewed As above  Assessment    The left breast shows no signs of infection today and was evaluated by ultrasound and felt to be benign. The right breast has 2 masses that have not been evaluated by ultrasound.     Plan    I do not see anything today that looks like it should be incised and drained. I would recommend getting an  ultrasound of the right breast to confirm that the 2 masses looked benign. If they do then I would plan to see her back in 3 months. She may call us at any time should either breast become red and swollen again        Alexandra Lynn 02/28/2014, 9:20 AM

## 2014-02-28 NOTE — Patient Instructions (Signed)
Will get right breast u/s Call if breast gets red and swollen again

## 2014-02-28 NOTE — Addendum Note (Signed)
Addended byFlossie Buffy on: 02/28/2014 10:01 AM   Modules accepted: Orders

## 2014-03-10 ENCOUNTER — Ambulatory Visit
Admission: RE | Admit: 2014-03-10 | Discharge: 2014-03-10 | Disposition: A | Discharge status: Home / Self Care | Payer: Medicaid Other | Source: Ambulatory Visit | Attending: General Surgery | Admitting: General Surgery

## 2014-03-10 DIAGNOSIS — N6310 Unspecified lump in the right breast, unspecified quadrant: Secondary | ICD-10-CM

## 2014-03-16 ENCOUNTER — Telehealth (INDEPENDENT_AMBULATORY_CARE_PROVIDER_SITE_OTHER): Payer: Self-pay

## 2014-03-16 NOTE — Telephone Encounter (Signed)
[place] benign results. Follow up in 3 months.

## 2014-03-16 NOTE — Telephone Encounter (Signed)
Message copied by Carlene Coria on Wed Mar 16, 2014  1:54 PM ------      Message from: Luella Cook III      Created: Fri Mar 11, 2014  3:19 PM       Looks benign. Is she coming back to see [place]? ------

## 2014-04-04 ENCOUNTER — Encounter: Payer: Self-pay | Admitting: Obstetrics & Gynecology

## 2014-04-04 ENCOUNTER — Ambulatory Visit (INDEPENDENT_AMBULATORY_CARE_PROVIDER_SITE_OTHER): Payer: Medicaid Other | Admitting: Obstetrics & Gynecology

## 2014-04-04 VITALS — BP 110/78 | HR 69 | Temp 98.1°F | Ht 61.0 in | Wt 97.0 lb

## 2014-04-04 DIAGNOSIS — N644 Mastodynia: Secondary | ICD-10-CM

## 2014-04-04 NOTE — Patient Instructions (Signed)

## 2014-04-04 NOTE — Progress Notes (Signed)
Patient ID: Brand Males, female   DOB: 1980-11-24, 33 y.o.   MRN: [id number]  Chief Complaint  Patient presents with  . Follow-up    Left breast pain/?purulent drainage    HPI Alexandra Lynn is a 33 y.o. female.   Female GU Problem Primary symptoms comment: left breast pain. The problem affects the left side. Associated symptoms comments: Purulent drainage.    Past Medical History  Diagnosis Date  . Ovarian cyst 2011    Past Surgical History  Procedure Laterality Date  . Laproscopic  2009    removal of fluid from ovary    Family History  Problem Relation Age of Onset  . Hearing loss Neg Hx   . Anesthesia problems Neg Hx     Social History History  Substance Use Topics  . Smoking status: Never Smoker   . Smokeless tobacco: Never Used  . Alcohol Use: No    No Known Allergies  Current Outpatient Prescriptions  Medication Sig Dispense Refill  . escitalopram (LEXAPRO) 10 MG tablet Take 10 mg by mouth daily.      Marland Kitchen omeprazole (PRILOSEC) 20 MG capsule Take 20 mg by mouth daily.       No current facility-administered medications for this visit.    Review of Systems Review of Systems Constitutional: negative for fatigue and weight loss Respiratory: negative for cough and wheezing Cardiovascular: negative for chest pain, fatigue and palpitations Gastrointestinal: negative for abdominal pain and change in bowel habits Genitourinary:negative for vaginal discharge Integument/breast: positive for nipple discharge, breast pain Musculoskeletal:negative for myalgias Neurological: negative for gait problems and tremors Behavioral/Psych: negative for abusive relationship, depression Endocrine: negative for temperature intolerance     Blood pressure 110/78, pulse 69, temperature 98.1 F (36.7 C), height 5\' 1"  (1.549 m), weight 43.999 kg (97 lb), last menstrual period 03/26/2014, not currently breastfeeding.  Physical Exam Physical Exam  Pulmonary/Chest: Left breast  exhibits no mass, no nipple discharge, no skin change and no tenderness.      Data Reviewed None  Assessment  Doubt recurrent/chronic nonpuerperal mastitis   Plan    F/u with General Surgery Follow up as needed.         JACKSON-MOORE,LISA A 04/04/2014, 9:14 PM

## 2014-06-28 ENCOUNTER — Ambulatory Visit (INDEPENDENT_AMBULATORY_CARE_PROVIDER_SITE_OTHER): Payer: Medicaid Other | Admitting: General Surgery

## 2014-07-18 IMAGING — MG MM DIAGNOSTIC UNILATERAL R
2 series · 2 of 2 positions shown · non-contrast
Comparison: With priors

CLINICAL DATA: 32-year-old patient recently seen in surgical
consultation by Dr. Ihua. He palpates masses in the 7 and 11 o'clock
positions of the right breast.

The patient has had a right breast fibroadenoma followed in the
past, in the [DATE] position of the right breast.
A Vietnamese language interpreter was present throughout the exam
today.
EXAM:
DIGITAL DIAGNOSTIC  RIGHT MAMMOGRAM WITH CAD
ULTRASOUND RIGHT BREAST

[R CC]
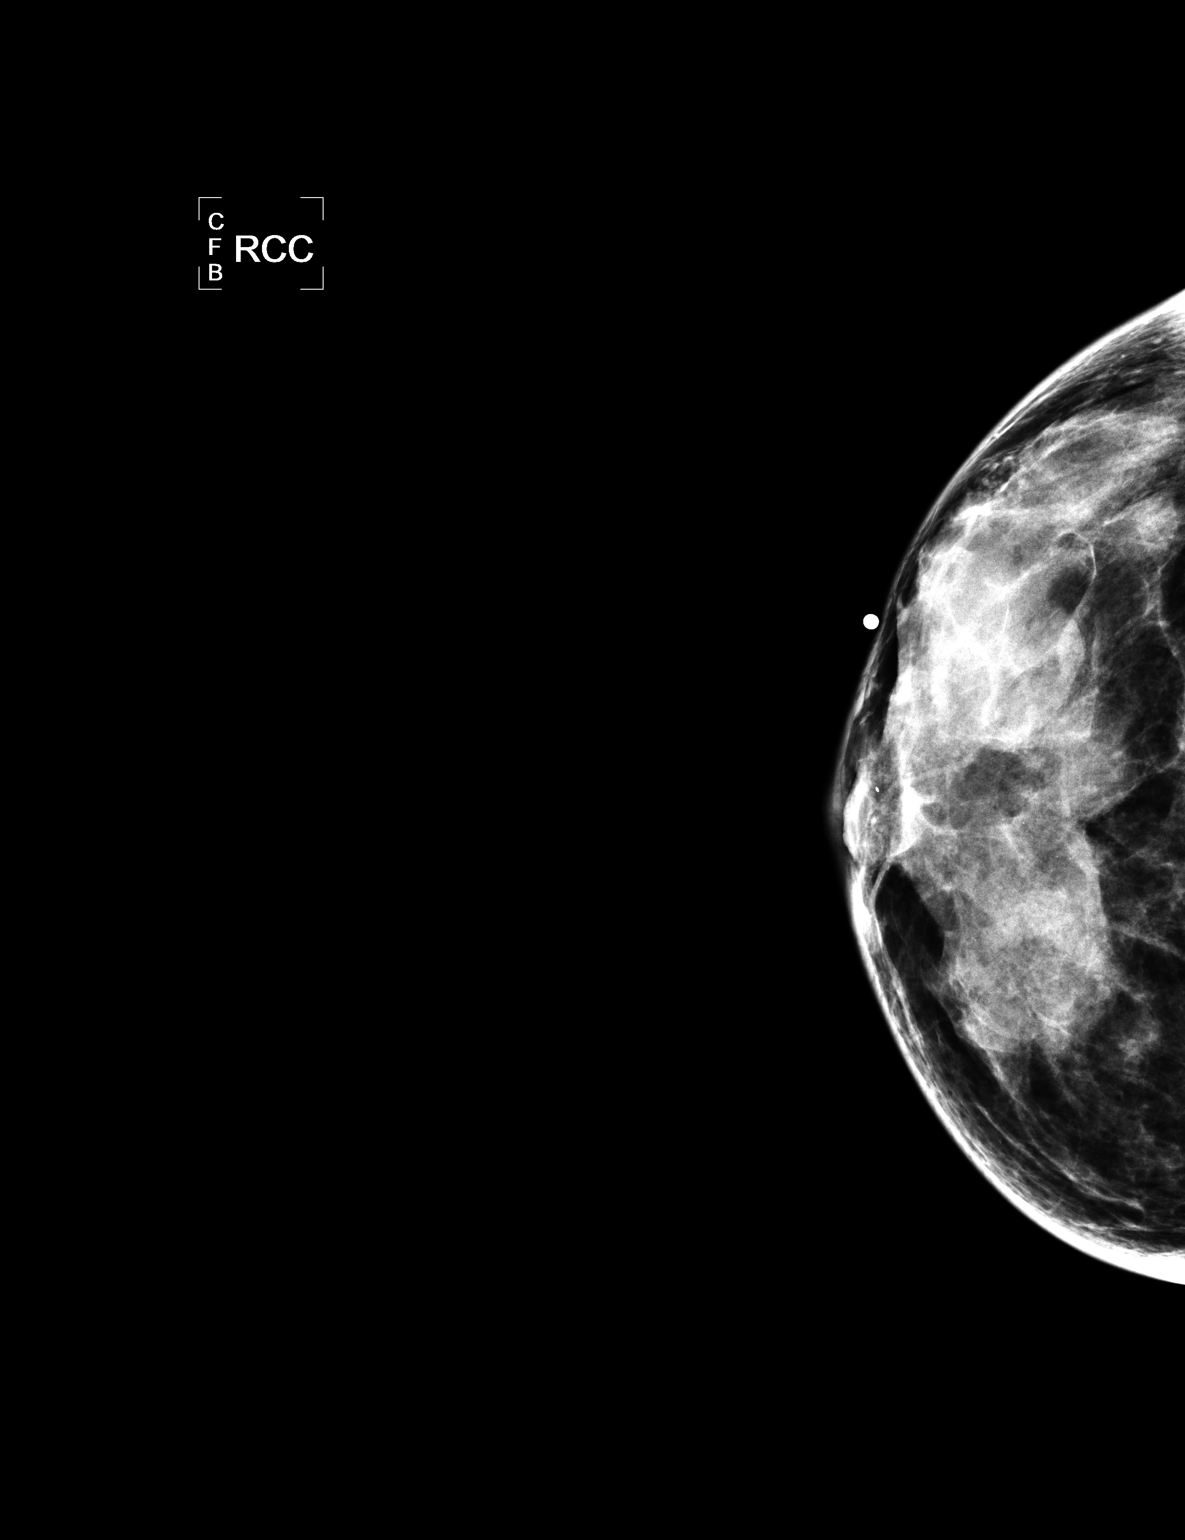

[R MLO]
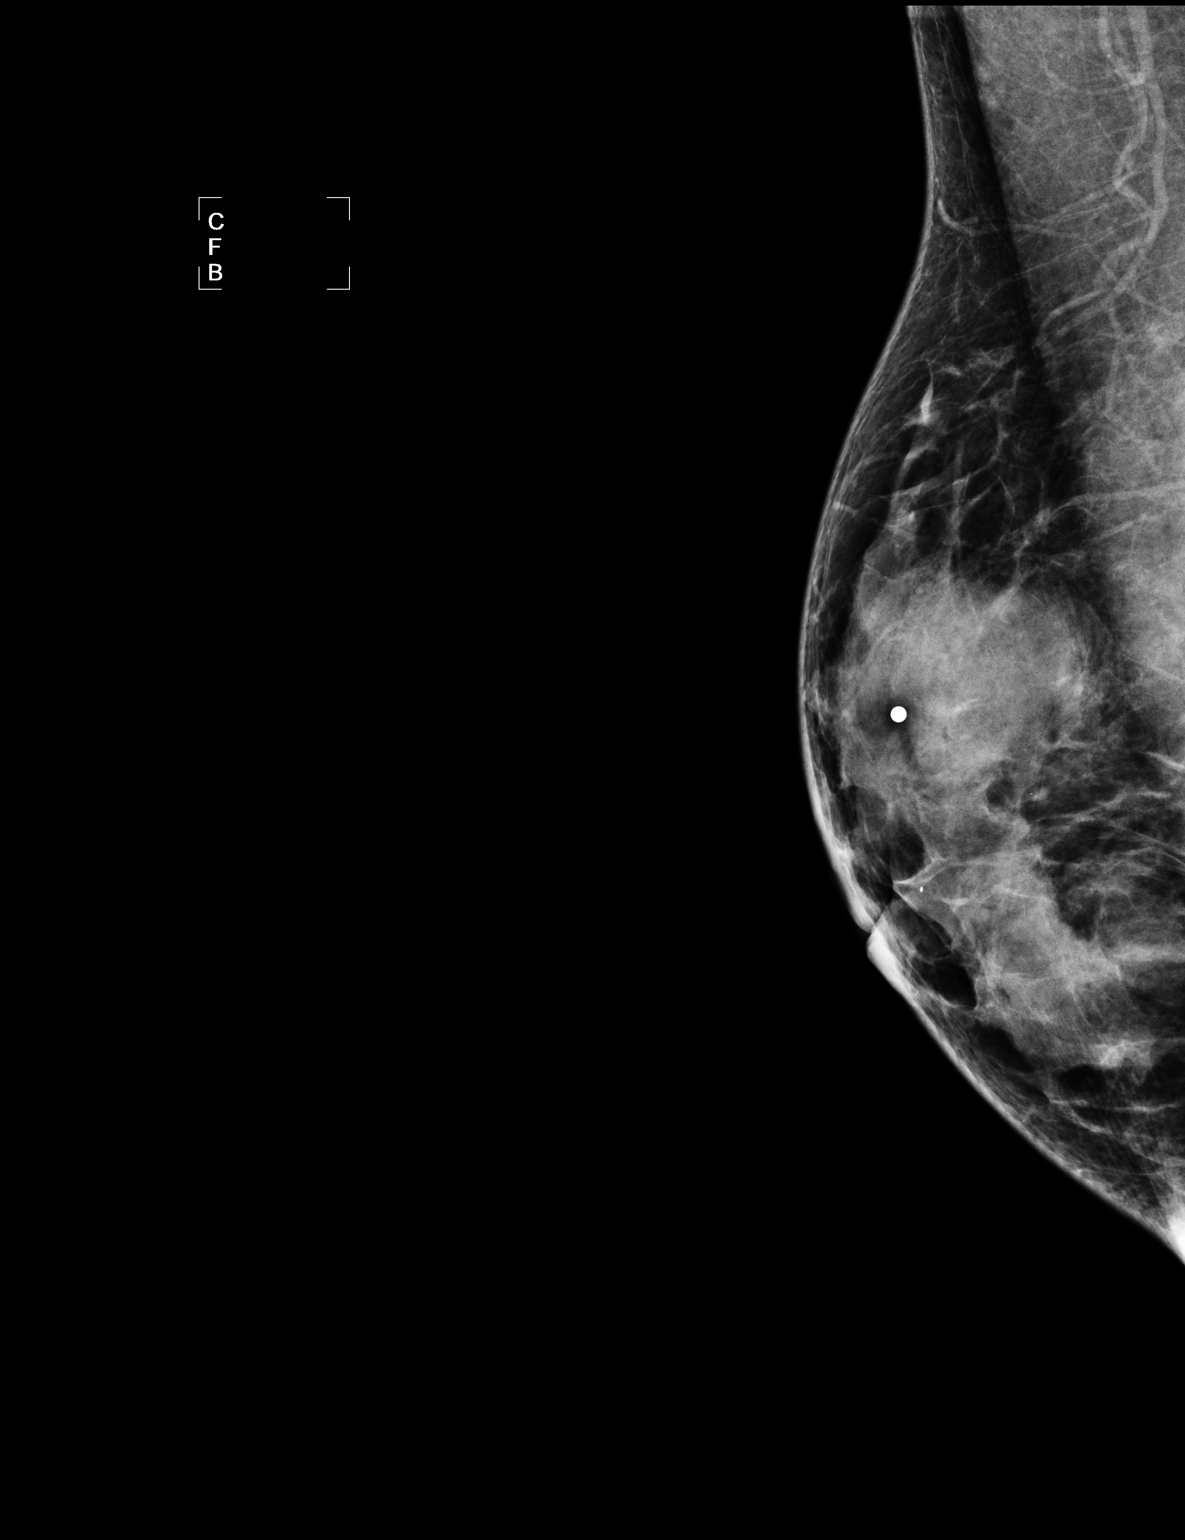

[2 of 2 positions shown; findings below may reference images not displayed]

ACR Breast Density Category c: The breast tissue is heterogeneously
dense, which may obscure small masses.
FINDINGS: Surgical scarring is noted near the nipple. Metallic skin marker was
placed over the site of a palpable mass in the outer right breast
that the patient feels. This corresponds to a partially obscured and
partially circumscribed mass that appears unchanged compared to
prior mammogram of 04/29/2013. No new mass, nonsurgical distortion,
or suspicious microcalcification is identified in the right breast.

Mammographic images were processed with CAD.

On physical exam, there is a discretely palpable smooth mobile mass
in the 7 o'clock position of the right breast approximately 1 cm
nipple.

There is a smooth mobile mass in the upper-outer quadrant of the
right breast that spans from approximately [DATE] position to 11
o'clock position, approximately 2 cm from the nipple.

Ultrasound is performed, showing a slightly hypoechoic circumscribed
oval mass with internal echogenic bands that measures 2.3 x 0.9 x
1.8 cm at 7 o'clock position 1 cm from the nipple. There is some
internal vascular flow. This is most likely a benign fibroadenoma.

Corresponding to the larger palpable mass in the upper-outer
quadrant of the right breast is a more hypoechoic and circumscribed
oval mass oriented parallel to the chest wall that measures 3.2 x
2.9 x 1.2 cm. This corresponds to the previously evaluated right
breast mass described as being in the [DATE] position on prior
ultrasounds dating back to a 08/07/2010. This is consistent with a
benign fibroadenoma. The last time it was measured in April 2013 it
measured 3.2 x 2.6 x 1.1 cm.
IMPRESSION: 1. Benign fibroadenoma from [DATE] to 11 o'clock position right breast
corresponding to the patient's larger palpable mass. This mass has
previously been evaluated with ultrasound and remains benign in
appearance. No further imaging follow-up is necessary for this mass.
2. Probable fibroadenoma 7 o'clock position right breast 1 cm from
the nipple. This has not been evaluated with ultrasound before
today.

RECOMMENDATION:
Six-month followup right breast ultrasound for evaluation of the
palpable mass 7 o'clock position.

I have discussed the findings and recommendations with the patient.
Results were also provided in writing at the conclusion of the
visit. If applicable, a reminder letter will be sent to the patient
regarding the next appointment.

BI-RADS CATEGORY  3: Probably benign.

## 2014-08-15 ENCOUNTER — Encounter: Payer: Self-pay | Admitting: Obstetrics & Gynecology

## 2014-10-03 ENCOUNTER — Other Ambulatory Visit: Payer: Self-pay | Admitting: Obstetrics & Gynecology

## 2014-10-03 DIAGNOSIS — N6310 Unspecified lump in the right breast, unspecified quadrant: Secondary | ICD-10-CM

## 2014-10-10 ENCOUNTER — Encounter: Payer: Self-pay | Admitting: *Deleted

## 2014-10-11 ENCOUNTER — Ambulatory Visit
Admission: RE | Admit: 2014-10-11 | Discharge: 2014-10-11 | Disposition: A | Discharge status: Home / Self Care | Payer: Medicaid Other | Source: Ambulatory Visit | Attending: Obstetrics & Gynecology | Admitting: Obstetrics & Gynecology

## 2014-10-11 ENCOUNTER — Encounter: Payer: Self-pay | Admitting: Obstetrics & Gynecology

## 2014-10-11 DIAGNOSIS — N6310 Unspecified lump in the right breast, unspecified quadrant: Secondary | ICD-10-CM

## 2014-12-01 ENCOUNTER — Ambulatory Visit (INDEPENDENT_AMBULATORY_CARE_PROVIDER_SITE_OTHER): Payer: Medicaid Other | Admitting: Certified Nurse Midwife

## 2014-12-01 ENCOUNTER — Encounter: Payer: Self-pay | Admitting: Certified Nurse Midwife

## 2014-12-01 VITALS — BP 105/65 | HR 85 | Temp 99.2°F | Ht 61.0 in | Wt 103.0 lb

## 2014-12-01 DIAGNOSIS — N760 Acute vaginitis: Secondary | ICD-10-CM | POA: Diagnosis not present

## 2014-12-01 MED ORDER — MICONAZOLE NITRATE 1200 & 2 MG & % VA KIT: 1.0000 | PACK | Freq: Once | VAGINAL | Status: DC

## 2014-12-01 NOTE — Progress Notes (Signed)
Patient ID: Brand Males, female   DOB: [birth year], 34 y.o.   MRN: [id number]   Chief Complaint  Patient presents with  . Problem    Vaginal Itching.     HPI Alexandra Lynn is a 34 y.o. female.  With complaints of vaginal itching/discharge for about two weeks.  New partner, does not desire contraception at this time.  Is using condoms, educated and encouraged contraception methods.   HPI  Past Medical History  Diagnosis Date  . Ovarian cyst 2011    Past Surgical History  Procedure Laterality Date  . Laproscopic  2009    removal of fluid from ovary    Family History  Problem Relation Age of Onset  . Hearing loss Neg Hx   . Anesthesia problems Neg Hx     Social History History  Substance Use Topics  . Smoking status: Never Smoker   . Smokeless tobacco: Never Used  . Alcohol Use: No    No Known Allergies  Current Outpatient Prescriptions  Medication Sig Dispense Refill  . escitalopram (LEXAPRO) 10 MG tablet Take 10 mg by mouth daily.    . miconazole (MONISTAT 1 COMBO PACK) kit Place 1 each vaginally once. 1 each 0  . omeprazole (PRILOSEC) 20 MG capsule Take 20 mg by mouth daily.     No current facility-administered medications for this visit.    Review of Systems Review of Systems Constitutional: negative for fatigue and weight loss Respiratory: negative for cough and wheezing Cardiovascular: negative for chest pain, fatigue and palpitations Gastrointestinal: negative for abdominal pain and change in bowel habits Genitourinary:negative Integument/breast: negative for nipple discharge Musculoskeletal:negative for myalgias Neurological: negative for gait problems and tremors Behavioral/Psych: negative for abusive relationship, depression Endocrine: negative for temperature intolerance     Blood pressure 105/65, pulse 85, temperature 99.2 F (37.3 C), height $RemoveBe'5\' 1"'rOnbPVAld$  (1.549 m), weight 46.72 kg (103 lb), last menstrual period 11/14/2014.  Physical Exam Physical  Exam General:   alert  Skin:   no rash or abnormalities  Lungs:   clear to auscultation bilaterally  Heart:   regular rate and rhythm, S1, S2 normal, no murmur, click, rub or gallop  Breasts:   small 1cm L &R breast fibroademoa masses around the 11'o clock area, no skin or nipple changes or axillary nodes  Abdomen:  normal findings: no organomegaly, soft, non-tender and no hernia  Pelvis:  External genitalia: normal general appearance Urinary system: urethral meatus normal and bladder without fullness, nontender Vaginal: slight tenderness, with erythema. No induration or masses      Data Reviewed Past medical history, radiology  Assessment     Fibrocystic breast disease Vaginitis     Plan    Orders Placed This Encounter  Procedures  . SureSwab, Vaginosis/Vaginitis Plus   Meds ordered this encounter  Medications  . miconazole (MONISTAT 1 COMBO PACK) kit    Sig: Place 1 each vaginally once.    Dispense:  1 each    Refill:  0    Ultrasound f/u every 6 months for breast fibroadenoma.

## 2014-12-06 ENCOUNTER — Other Ambulatory Visit: Payer: Self-pay | Admitting: Obstetrics

## 2014-12-06 DIAGNOSIS — B3731 Acute candidiasis of vulva and vagina: Secondary | ICD-10-CM

## 2014-12-06 DIAGNOSIS — B9689 Other specified bacterial agents as the cause of diseases classified elsewhere: Secondary | ICD-10-CM

## 2014-12-06 DIAGNOSIS — B373 Candidiasis of vulva and vagina: Secondary | ICD-10-CM

## 2014-12-06 DIAGNOSIS — N760 Acute vaginitis: Principal | ICD-10-CM

## 2014-12-06 LAB — SURESWAB, VAGINOSIS/VAGINITIS PLUS
Atopobium vaginae: NOT DETECTED
C. ALBICANS, DNA: ABNORMAL
C. PARAPSILOSIS, DNA: NOT DETECTED
C. TRACHOMATIS RNA, TMA: NOT DETECTED
C. TROPICALIS, DNA: NOT DETECTED
C. glabrata, DNA: NOT DETECTED
Gardnerella vaginalis: 6.5
LACTOBACILLUS SPECIES: NOT DETECTED
MEGASPHAERA SPECIES: NOT DETECTED
N. gonorrhoeae RNA, TMA: NOT DETECTED

## 2014-12-06 MED ORDER — TINIDAZOLE 500 MG PO TABS: 1000.0000 mg | ORAL_TABLET | Freq: Every day | ORAL | Status: DC

## 2014-12-06 MED ORDER — FLUCONAZOLE 150 MG PO TABS: 150.0000 mg | ORAL_TABLET | Freq: Once | ORAL | Status: DC

## 2014-12-15 ENCOUNTER — Encounter: Payer: Self-pay | Admitting: Certified Nurse Midwife

## 2014-12-15 ENCOUNTER — Ambulatory Visit (INDEPENDENT_AMBULATORY_CARE_PROVIDER_SITE_OTHER): Payer: Medicaid Other | Admitting: Certified Nurse Midwife

## 2014-12-15 VITALS — BP 107/75 | HR 75 | Temp 98.2°F | Wt 103.0 lb

## 2014-12-15 DIAGNOSIS — B373 Candidiasis of vulva and vagina: Secondary | ICD-10-CM

## 2014-12-15 DIAGNOSIS — N939 Abnormal uterine and vaginal bleeding, unspecified: Secondary | ICD-10-CM | POA: Diagnosis not present

## 2014-12-15 DIAGNOSIS — B3731 Acute candidiasis of vulva and vagina: Secondary | ICD-10-CM

## 2014-12-15 MED ORDER — TERCONAZOLE 0.4 % VA CREA: 1.0000 | TOPICAL_CREAM | Freq: Every day | VAGINAL | Status: DC

## 2014-12-15 MED ORDER — FLUCONAZOLE 150 MG PO TABS: 150.0000 mg | ORAL_TABLET | Freq: Once | ORAL | Status: DC

## 2014-12-15 MED ORDER — NORETHIN ACE-ETH ESTRAD-FE 1.5-30 MG-MCG PO TABS: 1.0000 | ORAL_TABLET | Freq: Every day | ORAL | Status: DC

## 2014-12-15 NOTE — Progress Notes (Signed)
Patient ID: Alexandra Lynn, female   DOB: [birth year], 34 y.o.   MRN: [id number]   Chief Complaint  Patient presents with  . Vaginitis    HPI Alexandra Lynn is a 34 y.o. female.  C/O vaginal itching/swelling and menses that are occuring 2+x/mo. For several months now.  Not sexually active, spouse is deceased.   Interpreter present for exam.  HPI  Past Medical History  Diagnosis Date  . Ovarian cyst 2011    Past Surgical History  Procedure Laterality Date  . Laproscopic  2009    removal of fluid from ovary    Family History  Problem Relation Age of Onset  . Hearing loss Neg Hx   . Anesthesia problems Neg Hx     Social History History  Substance Use Topics  . Smoking status: Never Smoker   . Smokeless tobacco: Never Used  . Alcohol Use: No    No Known Allergies  Current Outpatient Prescriptions  Medication Sig Dispense Refill  . escitalopram (LEXAPRO) 10 MG tablet Take 10 mg by mouth daily.    . fluconazole (DIFLUCAN) 150 MG tablet Take 1 tablet (150 mg total) by mouth once. 1 tablet 2  . miconazole (MONISTAT 1 COMBO PACK) kit Place 1 each vaginally once. (Patient not taking: Reported on 12/15/2014) 1 each 0  . norethindrone-ethinyl estradiol-iron (MICROGESTIN FE,GILDESS FE,LOESTRIN FE) 1.5-30 MG-MCG tablet Take 1 tablet by mouth daily. 1 Package 11  . omeprazole (PRILOSEC) 20 MG capsule Take 20 mg by mouth daily.    Marland Kitchen terconazole (TERAZOL 7) 0.4 % vaginal cream Place 1 applicator vaginally at bedtime. 45 g 0  . tinidazole (TINDAMAX) 500 MG tablet Take 2 tablets (1,000 mg total) by mouth daily with breakfast. (Patient not taking: Reported on 12/15/2014) 10 tablet 2   No current facility-administered medications for this visit.    Review of Systems Review of Systems Constitutional: negative for fatigue and weight loss Respiratory: negative for cough and wheezing Cardiovascular: negative for chest pain, fatigue and palpitations Gastrointestinal: negative for abdominal  pain and change in bowel habits Genitourinary:negative Integument/breast: negative for nipple discharge Musculoskeletal:negative for myalgias Neurological: negative for gait problems and tremors Behavioral/Psych: negative for abusive relationship, depression Endocrine: negative for temperature intolerance     Blood pressure 107/75, pulse 75, temperature 98.2 F (36.8 C), weight 46.72 kg (103 lb), last menstrual period 12/09/2014.  Physical Exam Physical Exam General:   alert  Skin:   no rash or abnormalities  Lungs:   clear to auscultation bilaterally  Heart:   regular rate and rhythm, S1, S2 normal, no murmur, click, rub or gallop  Pelvis:  External genitalia: normal general appearance.  Erythremic tissues around introitus, tender to palpation.      95% of 15 min visit spent on counseling and coordination of care.   Data Reviewed Previous medical hx, medications, labs  Assessment     Vulvovaginal Candidiasis AUB     Plan    Orders Placed This Encounter  Procedures  . US Transvaginal Non-OB    Standing Status: Future     Number of Occurrences:      Standing Expiration Date: 02/14/2016    Order Specific Question:  Reason for Exam (SYMPTOM  OR DIAGNOSIS REQUIRED)    Answer:  AUB    Order Specific Question:  Preferred imaging location?    Answer:  Internal   Meds ordered this encounter  Medications  . terconazole (TERAZOL 7) 0.4 % vaginal cream    Sig: Place 1 applicator  vaginally at bedtime.    Dispense:  45 g    Refill:  0  . norethindrone-ethinyl estradiol-iron (MICROGESTIN FE,GILDESS FE,LOESTRIN FE) 1.5-30 MG-MCG tablet    Sig: Take 1 tablet by mouth daily.    Dispense:  1 Package    Refill:  11  . fluconazole (DIFLUCAN) 150 MG tablet    Sig: Take 1 tablet (150 mg total) by mouth once.    Dispense:  1 tablet    Refill:  2      Follow up in 3 mo. For OCP/AUB,&  F/U with breast center.

## 2014-12-16 ENCOUNTER — Other Ambulatory Visit: Payer: Self-pay | Admitting: Certified Nurse Midwife

## 2014-12-16 DIAGNOSIS — N939 Abnormal uterine and vaginal bleeding, unspecified: Secondary | ICD-10-CM

## 2014-12-28 ENCOUNTER — Other Ambulatory Visit: Payer: Medicaid Other

## 2014-12-28 ENCOUNTER — Ambulatory Visit: Payer: Medicaid Other

## 2015-01-03 ENCOUNTER — Telehealth: Payer: Self-pay

## 2015-01-03 NOTE — Telephone Encounter (Signed)
spoke with sister, patient off work on Fridays sch u/s at [facility] at 1pm per their request - have full bladder

## 2015-01-04 ENCOUNTER — Other Ambulatory Visit: Payer: Medicaid Other

## 2015-01-13 ENCOUNTER — Ambulatory Visit (HOSPITAL_COMMUNITY)
Admission: RE | Admit: 2015-01-13 | Discharge: 2015-01-13 | Disposition: A | Discharge status: Home / Self Care | Payer: Medicaid Other | Source: Ambulatory Visit | Attending: Certified Nurse Midwife | Admitting: Certified Nurse Midwife

## 2015-01-13 ENCOUNTER — Ambulatory Visit (HOSPITAL_COMMUNITY): Payer: Medicaid Other

## 2015-01-13 DIAGNOSIS — N939 Abnormal uterine and vaginal bleeding, unspecified: Secondary | ICD-10-CM | POA: Diagnosis present

## 2015-04-28 ENCOUNTER — Other Ambulatory Visit: Payer: Self-pay | Admitting: Obstetrics & Gynecology

## 2015-04-28 DIAGNOSIS — D241 Benign neoplasm of right breast: Secondary | ICD-10-CM

## 2015-05-05 ENCOUNTER — Ambulatory Visit
Admission: RE | Admit: 2015-05-05 | Discharge: 2015-05-05 | Disposition: A | Discharge status: Home / Self Care | Payer: Medicaid Other | Source: Ambulatory Visit | Attending: Obstetrics & Gynecology | Admitting: Obstetrics & Gynecology

## 2015-05-05 ENCOUNTER — Other Ambulatory Visit: Payer: Medicaid Other

## 2015-05-05 DIAGNOSIS — D241 Benign neoplasm of right breast: Secondary | ICD-10-CM

## 2015-08-05 ENCOUNTER — Emergency Department (HOSPITAL_COMMUNITY)
Admission: EM | Admit: 2015-08-05 | Discharge: 2015-08-05 | Disposition: A | Discharge status: Home / Self Care | Payer: Medicaid Other | Attending: Emergency Medicine | Admitting: Emergency Medicine

## 2015-08-05 ENCOUNTER — Encounter (HOSPITAL_COMMUNITY): Payer: Self-pay

## 2015-08-05 DIAGNOSIS — Z8742 Personal history of other diseases of the female genital tract: Secondary | ICD-10-CM | POA: Insufficient documentation

## 2015-08-05 DIAGNOSIS — R21 Rash and other nonspecific skin eruption: Secondary | ICD-10-CM | POA: Diagnosis present

## 2015-08-05 DIAGNOSIS — R1033 Periumbilical pain: Secondary | ICD-10-CM

## 2015-08-05 DIAGNOSIS — Z79899 Other long term (current) drug therapy: Secondary | ICD-10-CM | POA: Diagnosis not present

## 2015-08-05 MED ORDER — IBUPROFEN 400 MG PO TABS: 400.0000 mg | ORAL_TABLET | Freq: Four times a day (QID) | ORAL | Status: DC | PRN

## 2015-08-05 MED ORDER — CEPHALEXIN 500 MG PO CAPS: 1000.0000 mg | ORAL_CAPSULE | Freq: Two times a day (BID) | ORAL | Status: DC

## 2015-08-05 NOTE — ED Notes (Signed)
Pt is [place]et[place] speaking pt, translator phone used for triage. Pt reports itchy rash to umbilicus. Small, red bumps noted around umbilicus, onset 3 days ago.

## 2015-08-05 NOTE — ED Notes (Signed)
Declined W/C at D/C and was escorted to lobby by RN. 

## 2015-08-05 NOTE — Discharge Instructions (Signed)
Please call and follow up with [facility] for further evaluation of your abdominal pain.  It may be due to a urachal cyst or possibly an abscess.  Take pain medication and antibiotic as prescribe.  Return to ER if you have any other concerns.

## 2015-08-05 NOTE — ED Provider Notes (Signed)
CSN: [id number]     Arrival date & time 08/05/15  1739 History   First MD Initiated Contact with Patient 08/05/15 1759     Chief Complaint  Patient presents with  . Rash     (Consider location/radiation/quality/duration/timing/severity/associated sxs/prior Treatment) HPI   34 year old [place] female presenting to the ED for evaluation of umbilical tenderness.  Patient is [place] speaking, history obtain through [place] by me. Patient report 4 years ago she had had laparoscopic abdominal surgery to remove fluid from ovary. Last year she notice drainage coming from her umbilicus with associate pain and itchiness lasting for about 3 days and resolved. For the past 2-3 days she has noticed the same drainage along with sharp pain and now itchiness to her umbilicus similar to prior. States pain is mild. Drainage is thought to be yellowish in color. States drainage is minimal and only appears when she expressed it. She reports decrease in appetite but denies having fever, chills, nausea, vomiting, diarrhea, dysuria, vaginal bleeding or vaginal discharge. She has not been seen for this complaint before. She denies any other specific treatment tried.  Past Medical History  Diagnosis Date  . Ovarian cyst 2011   Past Surgical History  Procedure Laterality Date  . Laproscopic  2009    removal of fluid from ovary   Family History  Problem Relation Age of Onset  . Hearing loss Neg Hx   . Anesthesia problems Neg Hx    Social History  Substance Use Topics  . Smoking status: Never Smoker   . Smokeless tobacco: Never Used  . Alcohol Use: No   OB History    Gravida Para Term Preterm AB TAB SAB Ectopic Multiple Living   '2 2 2 '$ 0 0 0 0 0 0 2     Review of Systems  All other systems reviewed and are negative.     Allergies  Review of patient's allergies indicates no known allergies.  Home Medications   Prior to Admission medications   Medication Sig Start Date End Date Taking?  Authorizing Provider  escitalopram (LEXAPRO) 10 MG tablet Take 10 mg by mouth daily.    Historical Provider, MD  fluconazole (DIFLUCAN) 150 MG tablet Take 1 tablet (150 mg total) by mouth once. 12/15/14   Rachelle A Denney, CNM  miconazole (MONISTAT 1 COMBO PACK) kit Place 1 each vaginally once. Patient not taking: Reported on 12/15/2014 12/01/14   Morene Crocker, CNM  norethindrone-ethinyl estradiol-iron (MICROGESTIN FE,GILDESS FE,LOESTRIN FE) 1.5-30 MG-MCG tablet Take 1 tablet by mouth daily. 12/15/14   Rachelle A Denney, CNM  omeprazole (PRILOSEC) 20 MG capsule Take 20 mg by mouth daily.    Historical Provider, MD  terconazole (TERAZOL 7) 0.4 % vaginal cream Place 1 applicator vaginally at bedtime. 12/15/14   Rachelle A Denney, CNM  tinidazole (TINDAMAX) 500 MG tablet Take 2 tablets (1,000 mg total) by mouth daily with breakfast. Patient not taking: Reported on 12/15/2014 12/06/14   Shelly Bombard, MD   BP 126/74 mmHg  Pulse 85  Temp(Src) 98 F (36.7 C) (Oral)  Resp 16  SpO2 99%  LMP 07/23/2015 Physical Exam  Constitutional: She appears well-developed and well-nourished. No distress.  HENT:  Head: Atraumatic.  Eyes: Conjunctivae are normal.  Neck: Neck supple.  Cardiovascular: Normal rate and regular rhythm.   Pulmonary/Chest: Effort normal and breath sounds normal.  Abdominal: Soft. Bowel sounds are normal. She exhibits no distension. There is tenderness (Mild tenderness at the umbilicus with a well-healing surgical scar and  no discharge appreciated. No surrounding cellulitis or abscess noted. Negative Murphy sign, no pain at McBurney's point, no peritoneal sign.). There is no rebound and no guarding.  Neurological: She is alert.  Skin: Rash (mild localize skin irritation to umbilicus without obvious cellulitic skin changes) noted.  Psychiatric: She has a normal mood and affect.  Nursing note and vitals reviewed.   ED Course  Procedures (including critical care time)  EMERGENCY  DEPARTMENT US SOFT TISSUE INTERPRETATION "Study: Limited Ultrasound of the noted body part in comments below"  INDICATIONS: Pain Multiple views of the body part are obtained with a multi-frequency linear probe  PERFORMED BY:  Myself  IMAGES ARCHIVED?: Yes  SIDE:Midline  BODY PART:Other soft tisse (comment in note)  FINDINGS: No abcess noted and Cellulitis absent  LIMITATIONS:  Emergent Procedure  INTERPRETATION:  No abcess noted and No cellulitis noted  COMMENT:  umbilical  8:65 PM Pt report occasional drainage from her umbilicus.  No obvious evidence of an abscess or cellulitis noted.  No prior hx of urachal cyst.  No systemic involvement.  Pt is concern, and given the location of her complaint i recommend pt to f/u with [facility] for further evaluation.  I will provide nsaids and keflex.  Doubt acute emergent medical condition such as appendicitis.      MDM   Final diagnoses:  Umbilical pain    BP 784/69 mmHg  Pulse 85  Temp(Src) 98 F (36.7 C) (Oral)  Resp 16  SpO2 99%  LMP 07/23/2015     Domenic Moras, PA-C 08/05/15 1831  Dorie Rank, MD 08/05/15 [phone]

## 2015-08-25 ENCOUNTER — Other Ambulatory Visit: Payer: Self-pay | Admitting: General Surgery

## 2015-08-25 DIAGNOSIS — R1033 Periumbilical pain: Secondary | ICD-10-CM

## 2015-09-04 ENCOUNTER — Ambulatory Visit
Admission: RE | Admit: 2015-09-04 | Discharge: 2015-09-04 | Disposition: A | Discharge status: Home / Self Care | Payer: Medicaid Other | Source: Ambulatory Visit | Attending: General Surgery | Admitting: General Surgery

## 2015-09-04 MED ORDER — IOPAMIDOL (ISOVUE-300) INJECTION 61%
100.0000 mL | Freq: Once | INTRAVENOUS | Status: AC | PRN
  Administered 2015-09-04: 100 mL via INTRAVENOUS

## 2015-11-30 ENCOUNTER — Ambulatory Visit (INDEPENDENT_AMBULATORY_CARE_PROVIDER_SITE_OTHER): Payer: Medicaid Other | Admitting: Certified Nurse Midwife

## 2015-11-30 ENCOUNTER — Encounter: Payer: Self-pay | Admitting: Certified Nurse Midwife

## 2015-11-30 VITALS — BP 94/70 | HR 86 | Temp 98.1°F | Wt 103.0 lb

## 2015-11-30 DIAGNOSIS — B3731 Acute candidiasis of vulva and vagina: Secondary | ICD-10-CM

## 2015-11-30 DIAGNOSIS — N761 Subacute and chronic vaginitis: Secondary | ICD-10-CM | POA: Diagnosis not present

## 2015-11-30 DIAGNOSIS — B373 Candidiasis of vulva and vagina: Secondary | ICD-10-CM

## 2015-11-30 LAB — COMPREHENSIVE METABOLIC PANEL
ALT: 14 U/L (ref 6–29)
AST: 19 U/L (ref 10–30)
Albumin: 4.1 g/dL (ref 3.6–5.1)
Alkaline Phosphatase: 62 U/L (ref 33–115)
BILIRUBIN TOTAL: 0.4 mg/dL (ref 0.2–1.2)
BUN: 9 mg/dL (ref 7–25)
CO2: 25 mmol/L (ref 20–31)
Calcium: 8.9 mg/dL (ref 8.6–10.2)
Chloride: 105 mmol/L (ref 98–110)
Creat: 0.57 mg/dL (ref 0.50–1.10)
GLUCOSE: 57 mg/dL — AB (ref 65–99)
Potassium: 3.7 mmol/L (ref 3.5–5.3)
Sodium: 140 mmol/L (ref 135–146)
Total Protein: 7.1 g/dL (ref 6.1–8.1)

## 2015-11-30 LAB — CBC WITH DIFFERENTIAL/PLATELET
BASOS ABS: 0 10*3/uL (ref 0.0–0.1)
BASOS PCT: 0 % (ref 0–1)
EOS ABS: 0.3 10*3/uL (ref 0.0–0.7)
Eosinophils Relative: 3 % (ref 0–5)
HCT: 41.5 % (ref 36.0–46.0)
HEMOGLOBIN: 13.3 g/dL (ref 12.0–15.0)
Lymphocytes Relative: 14 % (ref 12–46)
Lymphs Abs: 1.4 10*3/uL (ref 0.7–4.0)
MCH: 26 pg (ref 26.0–34.0)
MCHC: 32 g/dL (ref 30.0–36.0)
MCV: 81.1 fL (ref 78.0–100.0)
MPV: 8.7 fL (ref 8.6–12.4)
Monocytes Absolute: 0.6 10*3/uL (ref 0.1–1.0)
Monocytes Relative: 6 % (ref 3–12)
NEUTROS ABS: 7.5 10*3/uL (ref 1.7–7.7)
NEUTROS PCT: 77 % (ref 43–77)
PLATELETS: 318 10*3/uL (ref 150–400)
RBC: 5.12 MIL/uL — AB (ref 3.87–5.11)
RDW: 13.4 % (ref 11.5–15.5)
WBC: 9.8 10*3/uL (ref 4.0–10.5)

## 2015-11-30 LAB — HEMOGLOBIN A1C
HEMOGLOBIN A1C: 5.1 % (ref <5.7)
MEAN PLASMA GLUCOSE: 100 mg/dL (ref <117)

## 2015-11-30 NOTE — Progress Notes (Signed)
Patient ID: Brand Males, female   DOB: 05/07/1981, 35 y.o.   MRN: [id number]   Chief Complaint  Patient presents with  . Vaginal Discharge    Discharge with itching, odor    HPI Alexandra Lynn is a 35 y.o. female.  Vaginal discharge that is white with itching and a smell, since January has been having discharge.  Desires to have treatment.  Has not tried anything at home.  Desires to have f/u on previous ultrasound.  Currently sexually active, is not using birth control, doing natural family planning.  States her periods are normal.  Here for exam with interpreter.     Reviewed Normal [place] results from 01/13/15:  Endometrial complex measures 7 mm, within normal limits.  1.8 cm suspected right ovarian corpus luteal cyst.  Normal sonographic appearance of the left ovary.  HPI  Past Medical History  Diagnosis Date  . Ovarian cyst 2011    Past Surgical History  Procedure Laterality Date  . Laproscopic  2009    removal of fluid from ovary    Family History  Problem Relation Age of Onset  . Hearing loss Neg Hx   . Anesthesia problems Neg Hx     Social History Social History  Substance Use Topics  . Smoking status: Never Smoker   . Smokeless tobacco: Never Used  . Alcohol Use: No    No Known Allergies  No current outpatient prescriptions on file.   No current facility-administered medications for this visit.    Review of Systems Review of Systems Constitutional: negative for fatigue and weight loss Respiratory: negative for cough and wheezing Cardiovascular: negative for chest pain, fatigue and palpitations Gastrointestinal: negative for abdominal pain and change in bowel habits Genitourinary: + vaginal discharge Integument/breast: negative for nipple discharge Musculoskeletal:negative for myalgias Neurological: negative for gait problems and tremors Behavioral/Psych: negative for abusive relationship, depression Endocrine: negative for temperature intolerance      Blood pressure 94/70, pulse 86, temperature 98.1 F (36.7 C), weight 103 lb (46.72 kg), last menstrual period 11/17/2015.  Physical Exam Physical Exam General:   alert  Skin:   no rash or abnormalities  Lungs:   clear to auscultation bilaterally  Heart:   regular rate and rhythm, S1, S2 normal, no murmur, click, rub or gallop  Breasts:   normal without suspicious masses, skin or nipple changes or axillary nodes  Abdomen:  normal findings: no organomegaly, soft, non-tender and no hernia  Pelvis:  External genitalia: normal general appearance Urinary system: urethral meatus normal and bladder without fullness, nontender Vaginal: normal without tenderness, induration or masses Cervix: normal appearance Adnexa: normal bimanual exam Uterus: anteverted and non-tender, normal size    50% of 20 min visit spent on counseling and coordination of care.   Data Reviewed Previous medical hx, meds, labs  Assessment     Vulvovaginal candidiasis  Chronic vaginitis    Plan    Orders Placed This Encounter  Procedures  . COMPLETE METABOLIC PANEL WITH GFR  . Hemoglobin A1c  . CBC with Differential/Platelet  . HgB A1c  . Comprehensive metabolic panel   No orders of the defined types were placed in this encounter.     Possible management options include: endocrinology consult Follow up with annual exam in March.

## 2015-12-01 NOTE — Addendum Note (Signed)
Addended by: Lewie Loron D on: 12/01/2015 12:04 PM   Modules accepted: Orders

## 2015-12-07 LAB — SURESWAB, VAGINOSIS/VAGINITIS PLUS
ATOPOBIUM VAGINAE: 7.4 Log (cells/mL)
C. albicans, DNA: NOT DETECTED
C. glabrata, DNA: NOT DETECTED
C. parapsilosis, DNA: DETECTED — AB
C. trachomatis RNA, TMA: NOT DETECTED
C. tropicalis, DNA: NOT DETECTED
Gardnerella vaginalis: >8 Log (cells/mL)
LACTOBACILLUS SPECIES: NOT DETECTED Log (cells/mL)
MEGASPHAERA SPECIES: 7.7 Log (cells/mL)
N. gonorrhoeae RNA, TMA: NOT DETECTED
T. vaginalis RNA, QL TMA: NOT DETECTED

## 2015-12-10 ENCOUNTER — Other Ambulatory Visit: Payer: Self-pay | Admitting: Certified Nurse Midwife

## 2015-12-10 DIAGNOSIS — N760 Acute vaginitis: Secondary | ICD-10-CM

## 2015-12-10 DIAGNOSIS — B9689 Other specified bacterial agents as the cause of diseases classified elsewhere: Secondary | ICD-10-CM

## 2015-12-10 MED ORDER — METRONIDAZOLE 500 MG PO TABS: 500.0000 mg | ORAL_TABLET | Freq: Two times a day (BID) | ORAL | Status: DC

## 2015-12-10 MED ORDER — TERCONAZOLE 0.4 % VA CREA: 1.0000 | TOPICAL_CREAM | Freq: Every day | VAGINAL | Status: DC

## 2015-12-10 MED ORDER — FLUCONAZOLE 100 MG PO TABS: 100.0000 mg | ORAL_TABLET | Freq: Once | ORAL | Status: DC

## 2015-12-29 ENCOUNTER — Encounter: Payer: Self-pay | Admitting: Certified Nurse Midwife

## 2015-12-29 ENCOUNTER — Ambulatory Visit (INDEPENDENT_AMBULATORY_CARE_PROVIDER_SITE_OTHER): Payer: Medicaid Other | Admitting: Certified Nurse Midwife

## 2015-12-29 VITALS — BP 107/73 | HR 84 | Wt 106.0 lb

## 2015-12-29 DIAGNOSIS — Z113 Encounter for screening for infections with a predominantly sexual mode of transmission: Secondary | ICD-10-CM

## 2015-12-29 DIAGNOSIS — N939 Abnormal uterine and vaginal bleeding, unspecified: Secondary | ICD-10-CM

## 2015-12-29 DIAGNOSIS — Z01419 Encounter for gynecological examination (general) (routine) without abnormal findings: Secondary | ICD-10-CM | POA: Diagnosis not present

## 2015-12-29 DIAGNOSIS — B3731 Acute candidiasis of vulva and vagina: Secondary | ICD-10-CM

## 2015-12-29 DIAGNOSIS — Z86018 Personal history of other benign neoplasm: Secondary | ICD-10-CM

## 2015-12-29 DIAGNOSIS — B373 Candidiasis of vulva and vagina: Secondary | ICD-10-CM

## 2015-12-29 DIAGNOSIS — Z0000 Encounter for general adult medical examination without abnormal findings: Secondary | ICD-10-CM | POA: Diagnosis not present

## 2015-12-29 DIAGNOSIS — K5901 Slow transit constipation: Secondary | ICD-10-CM

## 2015-12-29 MED ORDER — POLYETHYLENE GLYCOL 3350 17 GM/SCOOP PO POWD: 17.0000 g | Freq: Every day | ORAL | Status: DC

## 2015-12-29 MED ORDER — DOCUSATE SODIUM 100 MG PO CAPS: 100.0000 mg | ORAL_CAPSULE | Freq: Two times a day (BID) | ORAL | Status: DC

## 2015-12-29 MED ORDER — MEDROXYPROGESTERONE ACETATE 150 MG/ML IM SUSP: 150.0000 mg | INTRAMUSCULAR | Status: DC

## 2015-12-29 MED ORDER — VITAFOL ULTRA 29-0.6-0.4-200 MG PO CAPS: 1.0000 | ORAL_CAPSULE | Freq: Every day | ORAL | Status: DC

## 2015-12-29 MED ORDER — TERCONAZOLE 0.4 % VA CREA: 1.0000 | TOPICAL_CREAM | Freq: Every day | VAGINAL | Status: DC

## 2015-12-29 MED ORDER — FLUCONAZOLE 100 MG PO TABS: 100.0000 mg | ORAL_TABLET | Freq: Once | ORAL | Status: DC

## 2015-12-29 NOTE — Progress Notes (Signed)
Patient ID: Alexandra Lynn, female   DOB: January 25, 1981, 35 y.o.   MRN: [id number]    Subjective:      Alexandra Lynn is a 35 y.o. female here for a routine exam.  Current complaints: vaginal itching, mild.  Spouse deceased, single mother.  Declines counseling at this time.  Periods: lasting 3 weeks, normal according to the patient, she has small clots or dysmenorrhea.  Desires full STD screening exam.  Interpreter present for exam.    Personal health questionnaire:  Is patient Alexandra Lynn, have a family history of breast and/or ovarian cancer: no Is there a family history of uterine cancer diagnosed at age < 8, gastrointestinal cancer, urinary tract cancer, family member who is a Field seismologist syndrome-associated carrier: no Is the patient overweight and hypertensive, family history of diabetes, personal history of gestational diabetes, preeclampsia or PCOS: no Is patient over 12, have PCOS,  family history of premature CHD under age 74, diabetes, smoke, have hypertension or peripheral artery disease:  no At any time, has a partner hit, kicked or otherwise hurt or frightened you?: no Over the past 2 weeks, have you felt down, depressed or hopeless?: occasionally feeling depressed, not currently getting counseling.   Over the past 2 weeks, have you felt little interest or pleasure in doing things?:sometimes   Gynecologic History Patient's last menstrual period was 12/09/2015. Contraception: abstinence Last Pap: unknown. Results were: normal according to the patient Last mammogram: N/A.   Obstetric History OB History  Gravida Para Term Preterm AB SAB TAB Ectopic Multiple Living  2 2 2  0 0 0 0 0 0 2    # Outcome Date GA Lbr Len/2nd Weight Sex Delivery Anes PTL Lv  2 Term 08/29/12 [redacted]w[redacted]d 11:00 / 02:42 7 lb 10.6 oz (3.475 kg) F Vag-Spont EPI  Y     Comments: none  1 Term               Past Medical History  Diagnosis Date  . Ovarian cyst 2011    Past Surgical History  Procedure Laterality  Date  . Laproscopic  2009    removal of fluid from ovary     Current outpatient prescriptions:  .  docusate sodium (COLACE) 100 MG capsule, Take 1 capsule (100 mg total) by mouth 2 (two) times daily., Disp: 90 capsule, Rfl: 1 .  fluconazole (DIFLUCAN) 100 MG tablet, Take 1 tablet (100 mg total) by mouth once. Repeat dose in 48-72 hour., Disp: 3 tablet, Rfl: 0 .  medroxyPROGESTERone (DEPO-PROVERA) 150 MG/ML injection, Inject 1 mL (150 mg total) into the muscle every 3 (three) months., Disp: 1 mL, Rfl: 0 .  polyethylene glycol powder (GLYCOLAX/MIRALAX) powder, Take 17 g by mouth daily., Disp: 500 g, Rfl: PRN .  Prenat-Fe Poly-Methfol-FA-DHA (VITAFOL ULTRA) 29-0.6-0.4-200 MG CAPS, Take 1 tablet by mouth daily at 10 pm., Disp: 30 capsule, Rfl: 12 .  terconazole (TERAZOL 7) 0.4 % vaginal cream, Place 1 applicator vaginally at bedtime., Disp: 45 g, Rfl: 0 No Known Allergies  Social History  Substance Use Topics  . Smoking status: Never Smoker   . Smokeless tobacco: Never Used  . Alcohol Use: No    Family History  Problem Relation Age of Onset  . Hearing loss Neg Hx   . Anesthesia problems Neg Hx       Review of Systems  Constitutional: negative for fatigue and weight loss Respiratory: negative for cough and wheezing Cardiovascular: negative for chest pain, fatigue and palpitations Gastrointestinal: negative for abdominal  pain and change in bowel habits Musculoskeletal:negative for myalgias Neurological: negative for gait problems and tremors Behavioral/Psych: negative for abusive relationship, depression Endocrine: negative for temperature intolerance   Genitourinary:negative for abnormal menstrual periods, genital lesions, hot flashes, sexual problems and vaginal discharge Integument/breast: negative for breast lump, breast tenderness, nipple discharge and skin lesion(s)    Objective:       BP 107/73 mmHg  Pulse 84  Wt 106 lb (48.081 kg)  LMP 12/09/2015 General:   alert   Skin:   no rash or abnormalities  Lungs:   clear to auscultation bilaterally  Heart:   regular rate and rhythm, S1, S2 normal, no murmur, click, rub or gallop  Breasts:   normal without suspicious masses, skin or nipple changes or axillary nodes  Abdomen:  normal findings: no organomegaly, soft, non-tender and no hernia  Pelvis:  External genitalia: normal general appearance Urinary system: urethral meatus normal and bladder without fullness, nontender Vaginal: normal without tenderness, induration or masses Cervix: normal appearance Adnexa: normal bimanual exam Uterus: anteverted and non-tender, slightly enlarged in size Fecal matter present in rectum   Lab Review Urine pregnancy test Labs reviewed yes Radiologic studies reviewed yes  50% of 45+ min visit spent on counseling and coordination of care.   Assessment:    Healthy female exam.   Hx of uterine fibroid  AUB  STD screening exam  Constipation  Vaginitis  Plan:    Education reviewed: calcium supplements, depression evaluation, safe sex/STD prevention, self breast exams, skin cancer screening and weight bearing exercise. Contraception: abstinence and Depo-Provera injections. Follow up in: 1 month.   Meds ordered this encounter  Medications  . medroxyPROGESTERone (DEPO-PROVERA) 150 MG/ML injection    Sig: Inject 1 mL (150 mg total) into the muscle every 3 (three) months.    Dispense:  1 mL    Refill:  0  . docusate sodium (COLACE) 100 MG capsule    Sig: Take 1 capsule (100 mg total) by mouth 2 (two) times daily.    Dispense:  90 capsule    Refill:  1  . polyethylene glycol powder (GLYCOLAX/MIRALAX) powder    Sig: Take 17 g by mouth daily.    Dispense:  500 g    Refill:  PRN  . Prenat-Fe Poly-Methfol-FA-DHA (VITAFOL ULTRA) 29-0.6-0.4-200 MG CAPS    Sig: Take 1 tablet by mouth daily at 10 pm.    Dispense:  30 capsule    Refill:  12  . fluconazole (DIFLUCAN) 100 MG tablet    Sig: Take 1 tablet (100 mg total)  by mouth once. Repeat dose in 48-72 hour.    Dispense:  3 tablet    Refill:  0  . terconazole (TERAZOL 7) 0.4 % vaginal cream    Sig: Place 1 applicator vaginally at bedtime.    Dispense:  45 g    Refill:  0   Orders Placed This Encounter  Procedures  . US Transvaginal Non-OB    Standing Status: Future     Number of Occurrences:      Standing Expiration Date: 02/27/2017    Order Specific Question:  Reason for Exam (SYMPTOM  OR DIAGNOSIS REQUIRED)    Answer:  hx of uterine fibroid, AUB    Order Specific Question:  Preferred imaging location?    Answer:  Internal  . US Pelvis Complete    Standing Status: Future     Number of Occurrences:      Standing Expiration Date: 02/27/2017  Order Specific Question:  Reason for Exam (SYMPTOM  OR DIAGNOSIS REQUIRED)    Answer:  aub, hx of uterine fibroid    Order Specific Question:  Preferred imaging location?    Answer:  Internal  . HIV antibody (with reflex)  . Hepatitis B surface antigen  . RPR  . Hepatitis C antibody    Possible management options include: Mirena IUD, endometrial ablation, hysteroscopy Follow up after pelvic US.

## 2015-12-30 LAB — RPR: RPR Ser Ql: NONREACTIVE

## 2015-12-30 LAB — HEPATITIS B SURFACE ANTIGEN: Hepatitis B Surface Ag: NEGATIVE

## 2015-12-30 LAB — HIV ANTIBODY (ROUTINE TESTING W REFLEX): HIV Screen 4th Generation wRfx: NONREACTIVE

## 2015-12-30 LAB — HEPATITIS C ANTIBODY: Hep C Virus Ab: <0.1 s/co ratio (ref 0.0–0.9)

## 2016-01-02 LAB — PAP IG AND HPV HIGH-RISK
HPV, high-risk: NEGATIVE
PAP Smear Comment: 0

## 2016-01-02 LAB — NUSWAB VG, CANDIDA 6SP
CANDIDA ALBICANS, NAA: NEGATIVE
CANDIDA PARAPSILOSIS, NAA: NEGATIVE
Candida glabrata, NAA: NEGATIVE
Candida krusei, NAA: NEGATIVE
Candida lusitaniae, NAA: NEGATIVE
Candida tropicalis, NAA: NEGATIVE
Trich vag by NAA: NEGATIVE

## 2016-01-18 ENCOUNTER — Ambulatory Visit (INDEPENDENT_AMBULATORY_CARE_PROVIDER_SITE_OTHER): Payer: Medicaid Other

## 2016-01-18 ENCOUNTER — Encounter: Payer: Self-pay | Admitting: Certified Nurse Midwife

## 2016-01-18 ENCOUNTER — Ambulatory Visit (INDEPENDENT_AMBULATORY_CARE_PROVIDER_SITE_OTHER): Payer: Medicaid Other | Admitting: Certified Nurse Midwife

## 2016-01-18 VITALS — BP 99/75 | HR 78 | Temp 98.7°F | Wt 105.0 lb

## 2016-01-18 DIAGNOSIS — Z86018 Personal history of other benign neoplasm: Secondary | ICD-10-CM

## 2016-01-18 DIAGNOSIS — R509 Fever, unspecified: Secondary | ICD-10-CM | POA: Diagnosis not present

## 2016-01-18 DIAGNOSIS — R112 Nausea with vomiting, unspecified: Secondary | ICD-10-CM | POA: Diagnosis not present

## 2016-01-18 DIAGNOSIS — N939 Abnormal uterine and vaginal bleeding, unspecified: Secondary | ICD-10-CM

## 2016-01-18 MED ORDER — ONDANSETRON HCL 4 MG PO TABS: 4.0000 mg | ORAL_TABLET | Freq: Every day | ORAL | Status: DC | PRN

## 2016-01-18 MED ORDER — ACETAMINOPHEN 500 MG PO TABS: 500.0000 mg | ORAL_TABLET | Freq: Four times a day (QID) | ORAL | Status: DC | PRN

## 2016-01-18 MED ORDER — IBUPROFEN 800 MG PO TABS: 800.0000 mg | ORAL_TABLET | Freq: Three times a day (TID) | ORAL | Status: DC | PRN

## 2016-01-18 MED ORDER — VITAFOL ULTRA 29-0.6-0.4-200 MG PO CAPS: 1.0000 | ORAL_CAPSULE | Freq: Every day | ORAL | Status: DC

## 2016-01-18 NOTE — Progress Notes (Signed)
Patient ID: Brand Males, female   DOB: April 20, 1981, 35 y.o.   MRN: [id number]  Chief Complaint  Patient presents with  . Follow-up    HPI Alexandra Lynn is a 35 y.o. female.  Here for f/u after ultrasound today.  Her ultrasound was normal, no masses or fibroids seen, endometrium was normal.  uterous was slightly retroflexed.  Patient denies any problems with her period.  Did have CT scan in 2016 that showed a ?fibroid, no evidence seen of that with [place].  Discussed results with patient who verbalized understanding via interpreter via telephone.  Reviewed lab work from March 2017, normal.   Patient does complain today of nausea and vomiting for 3 days along with fever.  Denies being around anyone else who is ill.  Has not tried anything for it.  Denies any diarrhea.  Stated that she went to work this morning.  Told her I wanted her to be out of work until Monday.  Has a primary care provider who she needs to see if she does not have any improvement in her symptoms.  Desires to keep taking the MTV that was prescribed to her last time.  Denies any gynecological symptoms today.      HPI  Past Medical History  Diagnosis Date  . Ovarian cyst 2011    Past Surgical History  Procedure Laterality Date  . Laproscopic  2009    removal of fluid from ovary    Family History  Problem Relation Age of Onset  . Hearing loss Neg Hx   . Anesthesia problems Neg Hx     Social History Social History  Substance Use Topics  . Smoking status: Never Smoker   . Smokeless tobacco: Never Used  . Alcohol Use: No    No Known Allergies  Current Outpatient Prescriptions  Medication Sig Dispense Refill  . docusate sodium (COLACE) 100 MG capsule Take 1 capsule (100 mg total) by mouth 2 (two) times daily. 90 capsule 1  . fluconazole (DIFLUCAN) 100 MG tablet Take 1 tablet (100 mg total) by mouth once. Repeat dose in 48-72 hour. 3 tablet 0  . medroxyPROGESTERone (DEPO-PROVERA) 150 MG/ML injection Inject 1 mL (150  mg total) into the muscle every 3 (three) months. 1 mL 0  . polyethylene glycol powder (GLYCOLAX/MIRALAX) powder Take 17 g by mouth daily. 500 g PRN  . terconazole (TERAZOL 7) 0.4 % vaginal cream Place 1 applicator vaginally at bedtime. 45 g 0  . acetaminophen (TYLENOL) 500 MG tablet Take 1 tablet (500 mg total) by mouth every 6 (six) hours as needed. 30 tablet 0  . ibuprofen (ADVIL,MOTRIN) 800 MG tablet Take 1 tablet (800 mg total) by mouth every 8 (eight) hours as needed. 60 tablet 1  . ondansetron (ZOFRAN) 4 MG tablet Take 1 tablet (4 mg total) by mouth daily as needed. 30 tablet 1  . Prenat-Fe Poly-Methfol-FA-DHA (VITAFOL ULTRA) 29-0.6-0.4-200 MG CAPS Take 1 tablet by mouth daily at 10 pm. 30 capsule 12   No current facility-administered medications for this visit.    Review of Systems Review of Systems Constitutional: negative for fatigue and weight loss Respiratory: negative for cough and wheezing Cardiovascular: negative for chest pain, fatigue and palpitations Gastrointestinal: negative for abdominal pain and change in bowel habits Genitourinary:negative Integument/breast: negative for nipple discharge Musculoskeletal:negative for myalgias Neurological: negative for gait problems and tremors Behavioral/Psych: negative for abusive relationship, depression Endocrine: negative for temperature intolerance     Blood pressure 99/75, pulse 78, temperature 98.7 F (37.1  C), weight 105 lb (47.628 kg), last menstrual period 12/09/2015.  Physical Exam Physical Exam General:   alert  Skin:   no rash or abnormalities  Lungs:   clear to auscultation bilaterally  Heart:   regular rate and rhythm, S1, S2 normal, no murmur, click, rub or gallop  Breasts:   normal without suspicious masses, skin or nipple changes or axillary nodes  Abdomen:  normal findings: no organomegaly, soft, non-tender and no hernia  Pelvis:  External genitalia: normal general appearance Urinary system: urethral  meatus normal and bladder without fullness, nontender Vaginal: normal without tenderness, induration or masses Cervix: normal appearance Adnexa: normal bimanual exam Uterus: anteverted and non-tender, normal size    90% of 15 min visit spent on counseling and coordination of care.   Data Reviewed Previous medical hx, meds, [place], labs  Assessment     Gastrointestinal virus      Plan   Letter for out of work until Monday.   No orders of the defined types were placed in this encounter.   Meds ordered this encounter  Medications  . ondansetron (ZOFRAN) 4 MG tablet    Sig: Take 1 tablet (4 mg total) by mouth daily as needed.    Dispense:  30 tablet    Refill:  1  . ibuprofen (ADVIL,MOTRIN) 800 MG tablet    Sig: Take 1 tablet (800 mg total) by mouth every 8 (eight) hours as needed.    Dispense:  60 tablet    Refill:  1  . acetaminophen (TYLENOL) 500 MG tablet    Sig: Take 1 tablet (500 mg total) by mouth every 6 (six) hours as needed.    Dispense:  30 tablet    Refill:  0  . Prenat-Fe Poly-Methfol-FA-DHA (VITAFOL ULTRA) 29-0.6-0.4-200 MG CAPS    Sig: Take 1 tablet by mouth daily at 10 pm.    Dispense:  30 capsule    Refill:  12     Possible management options include:IV hydration Follow up as needed or in 1 year for annual exam.

## 2016-07-16 ENCOUNTER — Other Ambulatory Visit: Payer: Self-pay | Admitting: Internal Medicine

## 2016-07-16 DIAGNOSIS — D241 Benign neoplasm of right breast: Secondary | ICD-10-CM

## 2016-08-01 ENCOUNTER — Other Ambulatory Visit: Payer: Self-pay | Admitting: Internal Medicine

## 2016-08-01 ENCOUNTER — Other Ambulatory Visit: Payer: Self-pay

## 2016-08-01 DIAGNOSIS — D241 Benign neoplasm of right breast: Secondary | ICD-10-CM

## 2016-08-02 ENCOUNTER — Ambulatory Visit
Admission: RE | Admit: 2016-08-02 | Discharge: 2016-08-02 | Disposition: A | Discharge status: Home / Self Care | Payer: Medicaid Other | Source: Ambulatory Visit | Attending: Internal Medicine | Admitting: Internal Medicine

## 2016-08-02 DIAGNOSIS — D241 Benign neoplasm of right breast: Secondary | ICD-10-CM

## 2018-09-21 ENCOUNTER — Ambulatory Visit (HOSPITAL_COMMUNITY)
Admission: EM | Admit: 2018-09-21 | Discharge: 2018-09-21 | Disposition: A | Discharge status: Home / Self Care | Payer: Medicaid Other | Attending: Family Medicine | Admitting: Family Medicine

## 2018-09-21 ENCOUNTER — Encounter (HOSPITAL_COMMUNITY): Payer: Self-pay | Admitting: Emergency Medicine

## 2018-09-21 DIAGNOSIS — Z3202 Encounter for pregnancy test, result negative: Secondary | ICD-10-CM

## 2018-09-21 DIAGNOSIS — R110 Nausea: Secondary | ICD-10-CM

## 2018-09-21 DIAGNOSIS — R42 Dizziness and giddiness: Secondary | ICD-10-CM

## 2018-09-21 LAB — POCT I-STAT, CHEM 8
BUN: 9 mg/dL (ref 6–20)
Calcium, Ion: 1.16 mmol/L (ref 1.15–1.40)
Chloride: 103 mmol/L (ref 98–111)
Creatinine, Ser: 0.5 mg/dL (ref 0.44–1.00)
Glucose, Bld: 79 mg/dL (ref 70–99)
HEMATOCRIT: 45 % (ref 36.0–46.0)
Hemoglobin: 15.3 g/dL — ABNORMAL HIGH (ref 12.0–15.0)
Potassium: 4.2 mmol/L (ref 3.5–5.1)
Sodium: 139 mmol/L (ref 135–145)
TCO2: 29 mmol/L (ref 22–32)

## 2018-09-21 LAB — POCT PREGNANCY, URINE: Preg Test, Ur: NEGATIVE

## 2018-09-21 MED ORDER — MECLIZINE HCL 12.5 MG PO TABS: 12.5000 mg | ORAL_TABLET | Freq: Three times a day (TID) | ORAL | 0 refills | Status: DC | PRN

## 2018-09-21 NOTE — Discharge Instructions (Signed)
Dizziness seems to be from vertigo Please use meclizine as needed every 8 hours for nausea and dizziness Please perform Epley maneuver to help with dizziness Please drink plenty of fluids to ensure you are hydrated Please make slow movements and pause when feeling dizzy  Please follow-up if symptoms worsening, persisting, not improving, developing changes in vision, chest pain, shortness of breath, passing out

## 2018-09-21 NOTE — ED Triage Notes (Signed)
Pt sts dizziness since waking on Saturday worse with position change and some nausea

## 2018-09-21 NOTE — ED Provider Notes (Signed)
[facility]    CSN: [id number] Arrival date & time: 09/21/18  1052     History   Chief Complaint Chief Complaint  Patient presents with  . Dizziness    HPI Alexandra Lynn is a 37 y.o. female no contributing past medical history presenting today for evaluation of dizziness.  Patient has had dizziness since waking up on Saturday, for 3 days.  Dizziness has been associated with nausea and occasional vomiting.  Worse with changing positions especially moving from lying to sitting.  Feels as if the room is spinning.  Denies previous issues similar to this.  Symptoms last for a few seconds and then subside as long as she is not moving.  Denies any weakness.  Denies changes in vision, besides difficulty focusing on a point when dizziness is occurring.  Patient has had poor oral intake due to the nausea.  She has not taken any medicines for symptoms.  Patient was recently sick with viral URI symptoms.  Denies headache.  HPI  Past Medical History:  Diagnosis Date  . Ovarian cyst 2011    Patient Active Problem List   Diagnosis Date Noted  . H/O uterine leiomyoma 12/29/2015  . Abnormal uterine bleeding (AUB) 12/15/2014  . Breast mass, right 02/28/2014  . Left breast mass 02/28/2014  . 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 laproscopic  2009   removal of fluid from ovary    OB History    Gravida  2   Para  2   Term  2   Preterm  0   AB  0   Living  2     SAB  0   TAB  0   Ectopic  0   Multiple  0   Live Births  1            Home Medications    Prior to Admission medications   Medication Sig Start Date End Date Taking? Authorizing Provider  acetaminophen (TYLENOL) 500 MG tablet Take 1 tablet (500 mg total) by mouth every 6 (six) hours as needed. 01/18/16   Kandis Cocking A, CNM  docusate sodium (COLACE) 100 MG capsule Take 1 capsule (100 mg total) by mouth 2 (two) times  daily. 12/29/15   Kandis Cocking A, CNM  fluconazole (DIFLUCAN) 100 MG tablet Take 1 tablet (100 mg total) by mouth once. Repeat dose in 48-72 hour. 12/29/15   Kandis Cocking A, CNM  ibuprofen (ADVIL,MOTRIN) 800 MG tablet Take 1 tablet (800 mg total) by mouth every 8 (eight) hours as needed. 01/18/16   Morene Crocker, CNM  meclizine (ANTIVERT) 12.5 MG tablet Take 1 tablet (12.5 mg total) by mouth 3 (three) times daily as needed for dizziness. 09/21/18   Malli Falotico C, PA-C  medroxyPROGESTERone (DEPO-PROVERA) 150 MG/ML injection Inject 1 mL (150 mg total) into the muscle every 3 (three) months. 12/29/15   Kandis Cocking A, CNM  ondansetron (ZOFRAN) 4 MG tablet Take 1 tablet (4 mg total) by mouth daily as needed. 01/18/16   Kandis Cocking A, CNM  polyethylene glycol powder (GLYCOLAX/MIRALAX) powder Take 17 g by mouth daily. 12/29/15   Denney, Ernst Bowler A, CNM  Prenat-Fe Poly-Methfol-FA-DHA (VITAFOL ULTRA) 29-0.6-0.4-200 MG CAPS Take 1 tablet by mouth daily at 10 pm. 01/18/16   Kandis Cocking A, CNM  terconazole (TERAZOL 7) 0.4 % vaginal cream Place 1 applicator vaginally at bedtime. 12/29/15   Morene Crocker, CNM  Family History Family History  Problem Relation Age of Onset  . Hearing loss Neg Hx   . Anesthesia problems Neg Hx     Social History Social History   Tobacco Use  . Smoking status: Never Smoker  . Smokeless tobacco: Never Used  Substance Use Topics  . Alcohol use: No    Alcohol/week: 0.0 standard drinks  . Drug use: No     Allergies   Patient has no known allergies.   Review of Systems Review of Systems  Constitutional: Negative for fatigue and fever.  HENT: Negative for congestion, sinus pressure and sore throat.   Eyes: Negative for photophobia, pain and visual disturbance.  Respiratory: Negative for cough and shortness of breath.   Cardiovascular: Negative for chest pain.  Gastrointestinal: Positive for nausea and vomiting. Negative for abdominal pain  and diarrhea.  Genitourinary: Negative for decreased urine volume and hematuria.  Musculoskeletal: Negative for myalgias, neck pain and neck stiffness.  Neurological: Positive for dizziness. Negative for syncope, facial asymmetry, speech difficulty, weakness, light-headedness, numbness and headaches.     Physical Exam Triage Vital Signs ED Triage Vitals [09/21/18 1213]  Enc Vitals Group     BP 136/86     Pulse Rate 79     Resp 18     Temp 97.9 F (36.6 C)     Temp Source Oral     SpO2 100 %     Weight      Height      Head Circumference      Peak Flow      Pain Score 0     Pain Loc      Pain Edu?      Excl. in [place]?    No data found.  Updated Vital Signs BP 136/86 (BP Location: Right Arm)   Pulse 79   Temp 97.9 F (36.6 C) (Oral)   Resp 18   SpO2 100%   Visual Acuity Right Eye Distance:   Left Eye Distance:   Bilateral Distance:    Right Eye Near:   Left Eye Near:    Bilateral Near:     Physical Exam  Constitutional: She is oriented to person, place, and time. She appears well-developed and well-nourished. No distress.  HENT:  Head: Normocephalic and atraumatic.  Bilateral ears without tenderness to palpation of external auricle, tragus and mastoid, EAC's without erythema or swelling, TM's with good bony landmarks and cone of light. Non erythematous.  Right TM slightly opaque  Oral mucosa pink and moist, no tonsillar enlargement or exudate. Posterior pharynx patent and nonerythematous, no uvula deviation or swelling. Normal phonation.  Eyes: Pupils are equal, round, and reactive to light. Conjunctivae and EOM are normal.  Neck: Neck supple.  Cardiovascular: Normal rate and regular rhythm.  No murmur heard. Pulmonary/Chest: Effort normal and breath sounds normal. No respiratory distress.  Breathing comfortably at rest, CTABL, no wheezing, rales or other adventitious sounds auscultated  Abdominal: Soft. There is no tenderness.  Musculoskeletal: She exhibits no  edema.  Neurological: She is alert and oriented to person, place, and time.  Patient A&O x3, cranial nerves II-XII grossly intact, strength at shoulders, hips and knees 5/5, equal bilaterally, patellar reflex 2+ bilaterally. Normal Finger to nose, RAM and heel to shin. Negative Romberg and Pronator Drift. Gait without abnormality.  Patient is able to ambulate from chair to exam table without abnormality or assistance  Dix-Hallpike did not reproduce symptoms turning head towards right or left, but did have reproduction  of symptoms from moving from sitting to standing  Skin: Skin is warm and dry.  Psychiatric: She has a normal mood and affect.  Nursing note and vitals reviewed.    UC Treatments / Results  Labs (all labs ordered are listed, but only abnormal results are displayed) Labs Reviewed  POCT I-STAT, CHEM 8 - Abnormal; Notable for the following components:      Result Value   Hemoglobin 15.3 (*)    All other components within normal limits  POC URINE PREG, ED  I-STAT CHEM 8, ED  POCT PREGNANCY, URINE    EKG None  Radiology No results found.  Procedures Procedures (including critical care time)  Medications Ordered in UC Medications - No data to display  Initial Impression / Assessment and Plan / UC Course  I have reviewed the triage vital signs and the nursing notes.  Pertinent labs & imaging results that were available during my care of the patient were reviewed by me and considered in my medical decision making (see chart for details).     Present was a most likely related to vertigo versus orthostasis.  May have had vertigo initially with increase in dehydration due to poor oral intake over the past few days.  No neuro deficits, vital signs stable.  Do not suspect central vertigo.  Will treat with meclizine for nausea and dizziness.  Discussed drowsiness regarding this.  Epley maneuver.  Pushing fluids.  No sudden movements.  Continue to monitor for decrease in  symptoms, follow-up if symptoms changing, worsening or persisting.Discussed strict return precautions. Patient verbalized understanding and is agreeable with plan.  Final Clinical Impressions(s) / UC Diagnoses   Final diagnoses:  Dizziness     Discharge Instructions     Dizziness seems to be from vertigo Please use meclizine as needed every 8 hours for nausea and dizziness Please perform Epley maneuver to help with dizziness Please drink plenty of fluids to ensure you are hydrated Please make slow movements and pause when feeling dizzy  Please follow-up if symptoms worsening, persisting, not improving, developing changes in vision, chest pain, shortness of breath, passing out    ED Prescriptions    Medication Sig Dispense Auth. Provider   meclizine (ANTIVERT) 12.5 MG tablet Take 1 tablet (12.5 mg total) by mouth 3 (three) times daily as needed for dizziness. 30 tablet Twilia Yaklin, [place] C, PA-C     Controlled Substance Prescriptions [place] Controlled Substance Registry consulted? Not Applicable   Janith Lima, [place] 09/23/18 0900

## 2018-09-26 ENCOUNTER — Emergency Department (HOSPITAL_COMMUNITY)
Admission: EM | Admit: 2018-09-26 | Discharge: 2018-09-26 | Disposition: A | Discharge status: Home / Self Care | Payer: Medicaid Other | Attending: Emergency Medicine | Admitting: Emergency Medicine

## 2018-09-26 ENCOUNTER — Encounter (HOSPITAL_COMMUNITY): Payer: Self-pay | Admitting: Emergency Medicine

## 2018-09-26 ENCOUNTER — Other Ambulatory Visit: Payer: Self-pay

## 2018-09-26 DIAGNOSIS — R42 Dizziness and giddiness: Secondary | ICD-10-CM | POA: Diagnosis present

## 2018-09-26 DIAGNOSIS — H8111 Benign paroxysmal vertigo, right ear: Secondary | ICD-10-CM | POA: Insufficient documentation

## 2018-09-26 DIAGNOSIS — Z79899 Other long term (current) drug therapy: Secondary | ICD-10-CM | POA: Diagnosis not present

## 2018-09-26 LAB — BASIC METABOLIC PANEL
ANION GAP: 10 (ref 5–15)
BUN: 10 mg/dL (ref 6–20)
CO2: 26 mmol/L (ref 22–32)
Calcium: 9.4 mg/dL (ref 8.9–10.3)
Chloride: 102 mmol/L (ref 98–111)
Creatinine, Ser: 0.57 mg/dL (ref 0.44–1.00)
GFR calc Af Amer: >60 mL/min (ref >60)
GLUCOSE: 76 mg/dL (ref 70–99)
Potassium: 3.7 mmol/L (ref 3.5–5.1)
Sodium: 138 mmol/L (ref 135–145)

## 2018-09-26 LAB — URINALYSIS, ROUTINE W REFLEX MICROSCOPIC
Bilirubin Urine: NEGATIVE
Glucose, UA: NEGATIVE mg/dL
Ketones, ur: 5 mg/dL — AB
Nitrite: NEGATIVE
Protein, ur: NEGATIVE mg/dL
Specific Gravity, Urine: 1.018 (ref 1.005–1.030)
pH: 6 (ref 5.0–8.0)

## 2018-09-26 LAB — CBC
HCT: 47.3 % — ABNORMAL HIGH (ref 36.0–46.0)
Hemoglobin: 14.8 g/dL (ref 12.0–15.0)
MCH: 24.3 pg — AB (ref 26.0–34.0)
MCHC: 31.3 g/dL (ref 30.0–36.0)
MCV: 77.5 fL — ABNORMAL LOW (ref 80.0–100.0)
Platelets: 397 10*3/uL (ref 150–400)
RBC: 6.1 MIL/uL — ABNORMAL HIGH (ref 3.87–5.11)
RDW: 12.8 % (ref 11.5–15.5)
WBC: 10.3 10*3/uL (ref 4.0–10.5)
nRBC: 0 % (ref 0.0–0.2)

## 2018-09-26 LAB — I-STAT BETA HCG BLOOD, ED (MC, WL, AP ONLY): I-stat hCG, quantitative: <5 m[IU]/mL (ref <5)

## 2018-09-26 LAB — CBG MONITORING, ED: Glucose-Capillary: 69 mg/dL — ABNORMAL LOW (ref 70–99)

## 2018-09-26 MED ORDER — MECLIZINE HCL 25 MG PO TABS: 25.0000 mg | ORAL_TABLET | Freq: Three times a day (TID) | ORAL | 0 refills | Status: DC | PRN

## 2018-09-26 NOTE — ED Provider Notes (Signed)
[facility] DEPT Provider Note   CSN: [id number] Arrival date & time: 09/26/18  1112     History   Chief Complaint Chief Complaint  Patient presents with  . Dizziness    HPI Alexandra Lynn is a 37 y.o. female.  The history is provided by the patient and medical records. Language interpreter used: brother.  Dizziness   Alexandra Lynn is a 37 y.o. female who presents to the Emergency Department complaining of dizziness. Level V caveat due to language barrier. History and interpretation provided by the patient's brother. Patient speaks montagnard. She presents to the emergency department complaining of two weeks of dizziness. It is described as a room spinning sensation that extinguishes with rest. It is worse with walking and turning her head to the right. She was seen in urgent care and prescribed meclizine 12.5 mg TID PRN. She presents today for reassessment due to ongoing intermittent dizziness. She has mild occasional headache, none currently. She denies any fevers, vomiting, diarrhea, visual changes, abdominal pain, chest pain. No prior similar symptoms. She has no significant past medical history. No contributory past family history. She does not smoke, drink alcohol or use drugs. Past Medical History:  Diagnosis Date  . Ovarian cyst 2011    Patient Active Problem List   Diagnosis Date Noted  . H/O uterine leiomyoma 12/29/2015  . Abnormal uterine bleeding (AUB) 12/15/2014  . Breast mass, right 02/28/2014  . Left breast mass 02/28/2014  . 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 laproscopic  2009   removal of fluid from ovary     OB History    Gravida  2   Para  2   Term  2   Preterm  0   AB  0   Living  2     SAB  0   TAB  0   Ectopic  0   Multiple  0   Live Births  1            Home Medications    Prior to Admission medications   Medication  Sig Start Date End Date Taking? Authorizing Provider  acetaminophen (TYLENOL) 500 MG tablet Take 1 tablet (500 mg total) by mouth every 6 (six) hours as needed. 01/18/16   Kandis Cocking A, CNM  docusate sodium (COLACE) 100 MG capsule Take 1 capsule (100 mg total) by mouth 2 (two) times daily. 12/29/15   Kandis Cocking A, CNM  fluconazole (DIFLUCAN) 100 MG tablet Take 1 tablet (100 mg total) by mouth once. Repeat dose in 48-72 hour. 12/29/15   Kandis Cocking A, CNM  ibuprofen (ADVIL,MOTRIN) 800 MG tablet Take 1 tablet (800 mg total) by mouth every 8 (eight) hours as needed. 01/18/16   Morene Crocker, CNM  meclizine (ANTIVERT) 25 MG tablet Take 1 tablet (25 mg total) by mouth 3 (three) times daily as needed for dizziness. 09/26/18   Quintella Reichert, MD  medroxyPROGESTERone (DEPO-PROVERA) 150 MG/ML injection Inject 1 mL (150 mg total) into the muscle every 3 (three) months. 12/29/15   Kandis Cocking A, CNM  ondansetron (ZOFRAN) 4 MG tablet Take 1 tablet (4 mg total) by mouth daily as needed. 01/18/16   Kandis Cocking A, CNM  polyethylene glycol powder (GLYCOLAX/MIRALAX) powder Take 17 g by mouth daily. 12/29/15   Denney, Rachelle A, CNM  Prenat-Fe Poly-Methfol-FA-DHA (VITAFOL ULTRA) 29-0.6-0.4-200 MG CAPS Take 1 tablet by mouth daily at 10 pm.  01/18/16   Kandis Cocking A, CNM  terconazole (TERAZOL 7) 0.4 % vaginal cream Place 1 applicator vaginally at bedtime. 12/29/15   Morene Crocker, CNM    Family History Family History  Problem Relation Age of Onset  . Hearing loss Neg Hx   . Anesthesia problems Neg Hx     Social History Social History   Tobacco Use  . Smoking status: Never Smoker  . Smokeless tobacco: Never Used  Substance Use Topics  . Alcohol use: No    Alcohol/week: 0.0 standard drinks  . Drug use: No     Allergies   Patient has no known allergies.   Review of Systems Review of Systems  Neurological: Positive for dizziness.  All other systems reviewed and are  negative.    Physical Exam Updated Vital Signs BP 113/81 (BP Location: Left Arm)   Pulse 72   Temp 97.8 F (36.6 C) (Oral)   Resp 16   LMP 09/15/2018   SpO2 99%   Physical Exam Vitals signs and nursing note reviewed.  Constitutional:      Appearance: She is well-developed.  HENT:     Head: Normocephalic and atraumatic.     Comments: Scarring to TMs bilaterally without erythema.    Mouth/Throat:     Mouth: Mucous membranes are moist.  Eyes:     Extraocular Movements: Extraocular movements intact.     Pupils: Pupils are equal, round, and reactive to light.  Cardiovascular:     Rate and Rhythm: Normal rate and regular rhythm.     Heart sounds: No murmur.  Pulmonary:     Effort: Pulmonary effort is normal. No respiratory distress.     Breath sounds: Normal breath sounds.  Abdominal:     Palpations: Abdomen is soft.     Tenderness: There is no abdominal tenderness. There is no guarding or rebound.  Musculoskeletal:        General: No tenderness.  Skin:    General: Skin is warm and dry.  Neurological:     Mental Status: She is alert and oriented to person, place, and time.     Comments: Visual fields are grossly intact. Five out of five strength in all four extremities with sensation to light touch intact in all four extremities. No facial asymmetry. No pronator drift. No ataxia on finger to nose bilaterally. Normal gait. Brief nystagmus to the right on gaze to right  Psychiatric:        Behavior: Behavior normal.      ED Treatments / Results  Labs (all labs ordered are listed, but only abnormal results are displayed) Labs Reviewed  CBC - Abnormal; Notable for the following components:      Result Value   RBC 6.10 (*)    HCT 47.3 (*)    MCV 77.5 (*)    MCH 24.3 (*)    All other components within normal limits  URINALYSIS, ROUTINE W REFLEX MICROSCOPIC - Abnormal; Notable for the following components:   APPearance HAZY (*)    Hgb urine dipstick SMALL (*)     Ketones, ur 5 (*)    Leukocytes, UA MODERATE (*)    Bacteria, UA RARE (*)    All other components within normal limits  CBG MONITORING, ED - Abnormal; Notable for the following components:   Glucose-Capillary 69 (*)    All other components within normal limits  BASIC METABOLIC PANEL  I-STAT BETA HCG BLOOD, ED ([facility], [facility], [facility] ONLY)    EKG None  Radiology No results found.  Procedures Procedures (including critical care time)  Medications Ordered in ED Medications - No data to display   Initial Impression / Assessment and Plan / ED Course  I have reviewed the triage vital signs and the nursing notes.  Pertinent labs & imaging results that were available during my care of the patient were reviewed by me and considered in my medical decision making (see chart for details).     Patient here for evaluation of two weeks of episodic dizziness. She does have brief nystagmus when she turns her head to the right, otherwise no focal neurologic deficits. No evidence of acute otitis media. Presentation is not consistent with cerebellar stroke, mass. Discussed with patient home care for BPPV. Discussed outpatient follow-up as well as return precautions.  Final Clinical Impressions(s) / ED Diagnoses   Final diagnoses:  Benign paroxysmal positional vertigo of right ear    ED Discharge Orders         Ordered    meclizine (ANTIVERT) 25 MG tablet  3 times daily PRN     09/26/18 1736           Quintella Reichert, MD 09/27/18 [phone]

## 2018-09-26 NOTE — ED Triage Notes (Addendum)
Per family member, patient c/o dizziness with movement and intermittent headache x2 weeks. Reports dizziness decreases when laying down. Seen at [facility] and prescribed meclizine without relief. Denies abdominal pain, N/V/D, chest pain and SOB.

## 2020-09-01 ENCOUNTER — Ambulatory Visit (HOSPITAL_COMMUNITY)
Admission: EM | Admit: 2020-09-01 | Discharge: 2020-09-01 | Disposition: A | Discharge status: Home / Self Care | Payer: Medicaid Other | Attending: Internal Medicine | Admitting: Internal Medicine

## 2020-09-01 ENCOUNTER — Other Ambulatory Visit: Payer: Self-pay

## 2020-09-01 ENCOUNTER — Encounter (HOSPITAL_COMMUNITY): Payer: Self-pay | Admitting: Emergency Medicine

## 2020-09-01 DIAGNOSIS — Z3202 Encounter for pregnancy test, result negative: Secondary | ICD-10-CM

## 2020-09-01 DIAGNOSIS — R1900 Intra-abdominal and pelvic swelling, mass and lump, unspecified site: Secondary | ICD-10-CM | POA: Diagnosis not present

## 2020-09-01 DIAGNOSIS — N898 Other specified noninflammatory disorders of vagina: Secondary | ICD-10-CM | POA: Insufficient documentation

## 2020-09-01 LAB — POCT URINALYSIS DIPSTICK, ED / UC
Bilirubin Urine: NEGATIVE
Glucose, UA: NEGATIVE mg/dL
Ketones, ur: NEGATIVE mg/dL
Nitrite: NEGATIVE
Protein, ur: NEGATIVE mg/dL
Specific Gravity, Urine: 1.02 (ref 1.005–1.030)
Urobilinogen, UA: 0.2 mg/dL (ref 0.0–1.0)
pH: 7.5 (ref 5.0–8.0)

## 2020-09-01 LAB — POC URINE PREG, ED: Preg Test, Ur: NEGATIVE

## 2020-09-01 MED ORDER — NYSTATIN-TRIAMCINOLONE 100000-0.1 UNIT/GM-% EX CREA: TOPICAL_CREAM | CUTANEOUS | 0 refills | Status: DC

## 2020-09-01 MED ORDER — FLUCONAZOLE 150 MG PO TABS: 150.0000 mg | ORAL_TABLET | Freq: Every day | ORAL | 0 refills | Status: DC

## 2020-09-01 MED ORDER — METRONIDAZOLE 500 MG PO TABS: 500.0000 mg | ORAL_TABLET | Freq: Two times a day (BID) | ORAL | 0 refills | Status: DC

## 2020-09-01 NOTE — Discharge Instructions (Signed)
Treating you for bacteria and yeast infection.  Take the medication as prescribed. Swab sent for testing.  We will call you with any positive results.

## 2020-09-01 NOTE — ED Triage Notes (Signed)
Pt c/o vaginal pain and swelling, yellow discharge and odor onset Monday. Pt states pain is a 10/10.

## 2020-09-02 LAB — URINE CULTURE: Culture: NO GROWTH

## 2020-09-04 LAB — CERVICOVAGINAL ANCILLARY ONLY
Bacterial Vaginitis (gardnerella): POSITIVE — AB
Candida Glabrata: NEGATIVE
Candida Vaginitis: NEGATIVE
Chlamydia: NEGATIVE
Comment: NEGATIVE
Comment: NEGATIVE
Comment: NEGATIVE
Comment: NEGATIVE
Comment: NEGATIVE
Comment: NORMAL
Neisseria Gonorrhea: NEGATIVE
Trichomonas: NEGATIVE

## 2020-09-04 NOTE — ED Provider Notes (Signed)
[facility]    CSN: [id number] Arrival date & time: 09/01/20  0086      History   Chief Complaint Chief Complaint  Patient presents with  . Groin Swelling  . Vaginal Discharge    HPI Alexandra Lynn is a 39 y.o. female.   Patient is a 38 year old female who presents today with vaginal discomfort, swelling, yellow discharge and odor since Monday.  Symptoms have been constant.  History of yeast infections.  No states abdominal pain, dysuria, hematuria or urinary Creacy.  No specific concerns for STDs. Translator used for entire encounter     Past Medical History:  Diagnosis Date  . Ovarian cyst 2011    Patient Active Problem List   Diagnosis Date Noted  . H/O uterine leiomyoma 12/29/2015  . Abnormal uterine bleeding (AUB) 12/15/2014  . Breast mass, right 02/28/2014  . Left breast mass 02/28/2014  . 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 laproscopic  2009   removal of fluid from ovary    OB History    Gravida  2   Para  2   Term  2   Preterm  0   AB  0   Living  2     SAB  0   TAB  0   Ectopic  0   Multiple  0   Live Births  1            Home Medications    Prior to Admission medications   Medication Sig Start Date End Date Taking? Authorizing Provider  fluconazole (DIFLUCAN) 150 MG tablet Take 1 tablet (150 mg total) by mouth daily. 09/01/20   Loura Halt A, NP  ibuprofen (ADVIL,MOTRIN) 800 MG tablet Take 1 tablet (800 mg total) by mouth every 8 (eight) hours as needed. 01/18/16   Morene Crocker, CNM  meclizine (ANTIVERT) 25 MG tablet Take 1 tablet (25 mg total) by mouth 3 (three) times daily as needed for dizziness. 09/26/18   Quintella Reichert, MD  metroNIDAZOLE (FLAGYL) 500 MG tablet Take 1 tablet (500 mg total) by mouth 2 (two) times daily. 09/01/20   Orvan July, NP  nystatin-triamcinolone (MYCOLOG II) cream Apply to affected area daily 09/01/20    Loura Halt A, NP  medroxyPROGESTERone (DEPO-PROVERA) 150 MG/ML injection Inject 1 mL (150 mg total) into the muscle every 3 (three) months. 12/29/15 09/01/20  Morene Crocker, CNM    Family History Family History  Problem Relation Age of Onset  . Hearing loss Neg Hx   . Anesthesia problems Neg Hx     Social History Social History   Tobacco Use  . Smoking status: Never Smoker  . Smokeless tobacco: Never Used  Substance Use Topics  . Alcohol use: No    Alcohol/week: 0.0 standard drinks  . Drug use: No     Allergies   Patient has no known allergies.   Review of Systems Review of Systems   Physical Exam Triage Vital Signs ED Triage Vitals  Enc Vitals Group     BP 09/01/20 0835 124/87     Pulse Rate 09/01/20 0835 80     Resp --      Temp 09/01/20 0835 98.2 F (36.8 C)     Temp Source 09/01/20 0835 Oral     SpO2 09/01/20 0835 100 %     Weight --      Height --  Head Circumference --      Peak Flow --      Pain Score 09/01/20 0833 10     Pain Loc --      Pain Edu? --      Excl. in [place]? --    No data found.  Updated Vital Signs BP 124/87 (BP Location: Left Arm)   Pulse 80   Temp 98.2 F (36.8 C) (Oral)   LMP 07/14/2020   SpO2 100%   Visual Acuity Right Eye Distance:   Left Eye Distance:   Bilateral Distance:    Right Eye Near:   Left Eye Near:    Bilateral Near:     Physical Exam Vitals and nursing note reviewed.  Constitutional:      General: She is not in acute distress.    Appearance: Normal appearance. She is not ill-appearing, toxic-appearing or diaphoretic.  HENT:     Head: Normocephalic.     Nose: Nose normal.  Eyes:     Conjunctiva/sclera: Conjunctivae normal.  Pulmonary:     Effort: Pulmonary effort is normal.  Genitourinary:    Comments: External vaginal area with generalized labial swelling, erythema.  Creamy white milky discharge noted at vaginal opening Musculoskeletal:        General: Normal range of motion.      Cervical back: Normal range of motion.  Skin:    General: Skin is warm and dry.     Findings: No rash.  Neurological:     Mental Status: She is alert.  Psychiatric:        Mood and Affect: Mood normal.      UC Treatments / Results  Labs (all labs ordered are listed, but only abnormal results are displayed) Labs Reviewed  POCT URINALYSIS DIPSTICK, ED / UC - Abnormal; Notable for the following components:      Result Value   Hgb urine dipstick TRACE (*)    Leukocytes,Ua TRACE (*)    All other components within normal limits  URINE CULTURE  POC URINE PREG, ED  CERVICOVAGINAL ANCILLARY ONLY    EKG   Radiology No results found.  Procedures Procedures (including critical care time)  Medications Ordered in UC Medications - No data to display  Initial Impression / Assessment and Plan / UC Course  I have reviewed the triage vital signs and the nursing notes.  Pertinent labs & imaging results that were available during my care of the patient were reviewed by me and considered in my medical decision making (see chart for details).     Vaginal discharge We will go and cover for BV and yeast based on symptoms, exam and history Medication as prescribed.  Swab sent for testing. Follow up as needed for continued or worsening symptoms  Final Clinical Impressions(s) / UC Diagnoses   Final diagnoses:  Vaginal discharge     Discharge Instructions     Treating you for bacteria and yeast infection.  Take the medication as prescribed. Swab sent for testing.  We will call you with any positive results.    ED Prescriptions    Medication Sig Dispense Auth. Provider   fluconazole (DIFLUCAN) 150 MG tablet Take 1 tablet (150 mg total) by mouth daily. 2 tablet Alix Lahmann A, NP   nystatin-triamcinolone (MYCOLOG II) cream Apply to affected area daily 15 g Dream Harman A, NP   metroNIDAZOLE (FLAGYL) 500 MG tablet Take 1 tablet (500 mg total) by mouth 2 (two) times daily. 14 tablet  Evren Shankland A,  NP     PDMP not reviewed this encounter.   Orvan July, NP 09/04/20 [phone]

## 2020-10-31 ENCOUNTER — Other Ambulatory Visit (HOSPITAL_COMMUNITY)
Admission: RE | Admit: 2020-10-31 | Discharge: 2020-10-31 | Disposition: A | Discharge status: Home / Self Care | Payer: Medicaid Other | Source: Ambulatory Visit | Attending: Nurse Practitioner | Admitting: Nurse Practitioner

## 2020-10-31 ENCOUNTER — Encounter: Payer: Self-pay | Admitting: Nurse Practitioner

## 2020-10-31 ENCOUNTER — Other Ambulatory Visit: Payer: Self-pay

## 2020-10-31 ENCOUNTER — Ambulatory Visit (INDEPENDENT_AMBULATORY_CARE_PROVIDER_SITE_OTHER): Payer: Medicaid Other | Admitting: Nurse Practitioner

## 2020-10-31 VITALS — BP 107/74 | HR 76 | Ht 61.0 in | Wt 115.0 lb

## 2020-10-31 DIAGNOSIS — R300 Dysuria: Secondary | ICD-10-CM

## 2020-10-31 DIAGNOSIS — Z01419 Encounter for gynecological examination (general) (routine) without abnormal findings: Secondary | ICD-10-CM | POA: Diagnosis not present

## 2020-10-31 LAB — POCT URINALYSIS DIPSTICK
Blood, UA: POSITIVE
Glucose, UA: NEGATIVE
Nitrite, UA: NEGATIVE
Protein, UA: NEGATIVE
Spec Grav, UA: 1.025 (ref 1.010–1.025)
Urobilinogen, UA: 4 E.U./dL — AB
pH, UA: 5 (ref 5.0–8.0)

## 2020-10-31 MED ORDER — PHENAZOPYRIDINE HCL 200 MG PO TABS: 200.0000 mg | ORAL_TABLET | Freq: Three times a day (TID) | ORAL | 0 refills | Status: DC

## 2020-10-31 MED ORDER — SULFAMETHOXAZOLE-TRIMETHOPRIM 800-160 MG PO TABS: 1.0000 | ORAL_TABLET | Freq: Two times a day (BID) | ORAL | 0 refills | Status: DC

## 2020-10-31 NOTE — Progress Notes (Signed)
GYNECOLOGY ANNUAL PREVENTATIVE CARE ENCOUNTER NOTE  Subjective:   Alexandra Lynn is a 40 y.o. G27P2002 female here for a routine annual gynecologic exam.  Current complaints: dysuria, frequency, pain with intercourse and vaginal discharge.   Denies abnormal vaginal bleeding, pelvic pain, or other gynecologic concerns.    Gynecologic History Patient's last menstrual period was 10/18/2020. Contraception: none - seeking pregnancy Last Pap: 2017. Results were: normal Last mammogram: 07-2016. Results were: normal  Obstetric History OB History  Gravida Para Term Preterm AB Living  2 2 2  0 0 2  SAB IAB Ectopic Multiple Live Births  0 0 0 0 1    # Outcome Date GA Lbr Len/2nd Weight Sex Delivery Anes PTL Lv  2 Term 08/29/12 [redacted]w[redacted]d 11:00 / 02:42 7 lb 10.6 oz (3.475 kg) F Vag-Spont EPI  LIV     Birth Comments: none  1 Term             Past Medical History:  Diagnosis Date  . Ovarian cyst 2011    Past Surgical History:  Procedure Laterality Date  . laproscopic  2009   removal of fluid from ovary    Current Outpatient Medications on File Prior to Visit  Medication Sig Dispense Refill  . [DISCONTINUED] medroxyPROGESTERone (DEPO-PROVERA) 150 MG/ML injection Inject 1 mL (150 mg total) into the muscle every 3 (three) months. 1 mL 0   No current facility-administered medications on file prior to visit.    No Known Allergies  Social History   Socioeconomic History  . Marital status: Widowed    Spouse name: Not on file  . Number of children: Not on file  . Years of education: Not on file  . Highest education level: Not on file  Occupational History  . Not on file  Tobacco Use  . Smoking status: Never Smoker  . Smokeless tobacco: Never Used  Substance and Sexual Activity  . Alcohol use: No    Alcohol/week: 0.0 standard drinks  . Drug use: No  . Sexual activity: Yes    Partners: Male    Birth control/protection: None  Other Topics Concern  . Not on file  Social History  Narrative  . Not on file   Social Determinants of Health   Financial Resource Strain: Not on file  Food Insecurity: Not on file  Transportation Needs: Not on file  Physical Activity: Not on file  Stress: Not on file  Social Connections: Not on file  Intimate Partner Violence: Not on file    Family History  Problem Relation Age of Onset  . Hearing loss Neg Hx   . Anesthesia problems Neg Hx     The following portions of the patient's history were reviewed and updated as appropriate: allergies, current medications, past family history, past medical history, past social history, past surgical history and problem list.  Review of Systems Pertinent items noted in HPI and remainder of comprehensive ROS otherwise negative.   Objective:  BP 107/74   Pulse 76   Ht 5\' 1"  (1.549 m)   Wt 115 lb (52.2 kg)   LMP 10/18/2020   BMI 21.73 kg/m  CONSTITUTIONAL: Well-developed, well-nourished female in no acute distress.  HENT:  Normocephalic, atraumatic, External right and left ear normal.  EYES: Conjunctivae and EOM are normal. Pupils are equal, round.  No scleral icterus.  NECK: Normal range of motion, supple, no masses.  Normal thyroid.  SKIN: Skin is warm and dry. No rash noted. Not diaphoretic. No erythema. No  pallor. NEUROLOGIC: Alert and oriented to person, place, and time. Normal reflexes, muscle tone coordination. No cranial nerve deficit noted. PSYCHIATRIC: Normal mood and affect. Normal behavior. Normal judgment and thought content. CARDIOVASCULAR: Normal heart rate noted, regular rhythm RESPIRATORY: Clear to auscultation bilaterally. Effort and breath sounds normal, no problems with respiration noted. BREASTS: Symmetric in size. No masses, skin changes, nipple drainage, or lymphadenopathy. ABDOMEN: Soft, no distention noted.  No tenderness, rebound or guarding.  PELVIC: Normal appearing external genitalia; normal appearing vaginal mucosa and cervix.  No abnormal discharge noted.   Pap smear obtained.  Normal uterine size, no other palpable masses, no uterine or adnexal tenderness. Investigation of pain with intercourse - client had pain when palpating lower surface of urethra in vagina and pelvic arch anteriorly.  Also had pain when palpating cervix and lower uterine segment.  No true CMT. MUSCULOSKELETAL: Normal range of motion. No tenderness.  No cyanosis, clubbing, or edema.    Assessment and Plan:  1. Encounter for well woman exam with routine gynecological exam On pelvic exam - client had pain with palpation of base of urethra in vagina.  Pelvic bone area anteriorly and with cervix and uterus.  Likely having pressure on cervix during intercourse.  Advised penetration to the side rather that straight in.  Also advised lubricant during intercourse. Wants to conceive and has been trying - has a new partner as her husband is deceased - advised intercourse 2 weeks prior to menses. Advised that after age 85 it may be more difficult to conceive.  - Cytology - PAP( [facility]) - Cervicovaginal ancillary only( [facility]) - MM Digital Screening; Future - RPR+HBsAg+HCVAb+... - POCT urine pregnancy - Urine Culture - HIV antibody (with reflex)  2. Dysuria Wanted antibiotics today as states she is having so much pain.  Did not want to wait for culture results first.  Medication sent to her pharmacy.  Will follow up results of pap smear and manage accordingly. Mammogram scheduled Routine preventative health maintenance measures emphasized. Please refer to After Visit Summary for other counseling recommendations.    Earlie Server, RN, MSN, NP-BC Nurse Practitioner, [place] for [facility]

## 2020-10-31 NOTE — Patient Instructions (Signed)
Kh ?i ti?u Dysuria Kh ?i ti?u l tnh tr?ng ?au ho?c kh ch?u khi ti?u ti?n. Qu v? c th? c?m th?y ?au ho?c kh ch?u ? b? ph?n c? th? d?n l?u n??c ti?u t? bng quang (ni?u ??o) ho?c ? m [place] b? ph?n sinh d?c. Qu v? c?ng c th? c?m th?y ?au ? vng b?n, b?ng d??i ho?c th?t l?ng. Qu v? c th? ph?i ?i ti?u th??ng xuyn ho?c c c?m gic ??t ng?t ph?i ?i ti?u (mt ti?u). Kh ?i ti?u c th? ?nh h??ng ??n b?t k? ai, nh?ng th??ng g?p h?n ? ph? n?. Kh ?i ti?u c th? do nhi?u nguyn nhn khc nhau, bao g?m:  Nhi?m trng ???ng ti?u.  S?i th?n ho?c s?i bng quang.  M?t s? STI (b?nh ly truy?n qua ???ng tnh d?c), ch?ng h?n nh? b?nh chlamydia.  M?t n??c.  Vim cc m m ??o.  Dng m?t s? lo?i thu?c nh?t ??nh.  S? d?ng m?t s? lo?i x phng ho?c s?n ph?m c mi th?m gy kch ?ng. Tun th? nh?ng h??ng d?n ny ? nh: Thu?c  Ch? s? d?ng thu?c khng k ??n v thu?c k ??n theo ch? d?n c?a chuyn gia ch?m [place] s?c kh?e.  N?u qu v? ???c k ??n thu?c khng sinh, hy dng thu?c theo ch? d?n c?a chuyn gia ch?m [place] s?c kh?e. Khng d?ng s? d?ng thu?c khng sinh ngay c? khi qu v? b?t ??u c?m th?y ?? h?n. ?n v u?ng  U?ng ?? n??c ?? gi? cho n??c ti?u c mu vng nh?t.  Trnh ?? u?ng ch?a caffein, tr v r??u. Nh?ng ?? u?ng ny c th? gy kch thch bng quang v khi?n ch?ng kh ti?u tr?m tr?ng h?n. ? nam gi?i, r??u c th? gy kch thch tuy?n ti?n li?t.   H??ng d?n chung  Theo di tnh tr?ng c?a qu v? ?? pht hi?n b?t k? thay ??i no.  ?i ti?u th??ng xuyn. Trnh nh?n ti?u trong th?i [employer].  N?u qu v? l n?, quy? vi? nn lau t?? ???ng tr??c ra ??ng sau sau khi ti?u ti?n ho??c ?a?i ti?n. M?i mi?ng gi?y v? sinh ch? s? d?ng m?t l?n.  ?i ti?u sau khi quan h? tnh d?c.  Tun th? theo t?t c? cc l?n khm l?i. ?i?u ny c vai tr quan tr?ng.  N?u ? ???c lm b?t k? xt nghi?m no ?? tm nguyn nhn gy ch?ng ti?u kh, qu v? ph?i t? ?i l?y k?t qu? xt nghi?m c?a mnh. Hy h?i chuyn gia  ch?m [place] s?c kh?e ho?c khoa th?c hi?n vi?c ki?m tra khi no c k?t qu? c?a qu v?. Hy lin l?c v?i chuyn gia ch?m [place] s?c kh?e n?u:  Qu v? b? s?t.  Qu v? b? ?au ? l?ng ho?c ?au ? m?ng s??n.  Qu v? b? bu?n nn ho?c nn m?a.  Qu v? ?i ti?u c mu.  Qu v? khng ?i ti?u th??ng xuyn nh? bnh th??ng. Yu c?u tr? gip ngay l?p t?c n?u:  Qu v? ?au d? d?i v khng thuyn gi?m sau khi dng thu?c.  Qu v? khng th? ?n ho?c u?ng v vi?c ny khi?n qu v? b? nn.  Qu v? b? l l?n.  Qu v? c nh?p tim nhanh khi ngh? ng?i.  Qu v? b? ?n l?nh ho?c rng mnh.  Qu v? c?m th?y v cng y?u. Tm t?t  Ch?ng kh ti?u l c?m gic ?au ho?c c?m gic kh ch?u khi ?i ti?u. Nhi?u tnh tr?ng  khc nhau c th? d?n ??n kh ?i ti?u.  N?u qu v? b? kh ?i ti?u, qu v? c th? ph?i ?i ti?u th??ng xuyn ho?c c c?m gic ??t ng?t ph?i ?i ti?u (mt ti?u).  Theo di tnh tr?ng c?a qu v? ?? pht hi?n b?t k? thay ??i no. Tun th? theo t?t c? cc l?n khm l?i.  ??m b?o qu v? ?i ti?u th??ng xuyn v u?ng ?? n??c ?? gi? cho n??c ti?u c mu vng nh?t. Thng tin ny khng nh?m m?c ?ch thay th? cho l?i khuyn m chuyn gia ch?m [place] s?c kh?e ni v?i qu v?. Hy b?o ??m qu v? ph?i th?o lu?n b?t k? v?n ?? g m qu v? c v?i chuyn gia ch?m [place] s?c kh?e c?a qu v?. Document Revised: 07/21/2020 Document Reviewed: 07/21/2020 Elsevier Patient Education  2021 [employer].

## 2020-10-31 NOTE — Progress Notes (Signed)
Alexandra Lynn #[id number]  NEW GYN presents for AEX/PAP/STI.  C/o malodorous, yellowish vaginal discharge, vaginal pain, dysuria.  PHQ-9=4

## 2020-11-01 LAB — CYTOLOGY - PAP
Comment: NEGATIVE
Diagnosis: NEGATIVE
High risk HPV: NEGATIVE

## 2020-11-01 LAB — RPR+HBSAG+HCVAB+...
HIV Screen 4th Generation wRfx: NONREACTIVE
Hep C Virus Ab: <0.1 s/co ratio (ref 0.0–0.9)
Hepatitis B Surface Ag: NEGATIVE
RPR Ser Ql: NONREACTIVE

## 2020-11-01 LAB — CERVICOVAGINAL ANCILLARY ONLY
Bacterial Vaginitis (gardnerella): POSITIVE — AB
Candida Glabrata: NEGATIVE
Candida Vaginitis: NEGATIVE
Chlamydia: NEGATIVE
Comment: NEGATIVE
Comment: NEGATIVE
Comment: NEGATIVE
Comment: NEGATIVE
Comment: NEGATIVE
Comment: NORMAL
Neisseria Gonorrhea: NEGATIVE
Trichomonas: NEGATIVE

## 2020-11-01 MED ORDER — METRONIDAZOLE 500 MG PO TABS: 500.0000 mg | ORAL_TABLET | Freq: Two times a day (BID) | ORAL | 0 refills | Status: DC

## 2020-11-01 NOTE — Addendum Note (Signed)
Addended by: [name] on: 11/01/2020 01:25 PM   Modules accepted: Orders

## 2020-11-07 ENCOUNTER — Encounter: Payer: Self-pay | Admitting: *Deleted

## 2020-11-09 LAB — URINE CULTURE

## 2020-11-20 ENCOUNTER — Encounter (HOSPITAL_COMMUNITY): Payer: Self-pay | Admitting: *Deleted

## 2020-11-20 ENCOUNTER — Other Ambulatory Visit: Payer: Self-pay

## 2020-11-20 ENCOUNTER — Ambulatory Visit (HOSPITAL_COMMUNITY)
Admission: EM | Admit: 2020-11-20 | Discharge: 2020-11-20 | Disposition: A | Discharge status: Home / Self Care | Payer: Medicaid Other | Attending: Student | Admitting: Student

## 2020-11-20 DIAGNOSIS — M940 Chondrocostal junction syndrome [Tietze]: Secondary | ICD-10-CM | POA: Insufficient documentation

## 2020-11-20 DIAGNOSIS — Z1152 Encounter for screening for COVID-19: Secondary | ICD-10-CM | POA: Insufficient documentation

## 2020-11-20 DIAGNOSIS — G44209 Tension-type headache, unspecified, not intractable: Secondary | ICD-10-CM | POA: Diagnosis present

## 2020-11-20 DIAGNOSIS — J209 Acute bronchitis, unspecified: Secondary | ICD-10-CM | POA: Diagnosis present

## 2020-11-20 LAB — SARS CORONAVIRUS 2 (TAT 6-24 HRS): SARS Coronavirus 2: NEGATIVE

## 2020-11-20 MED ORDER — PREDNISONE 20 MG PO TABS: 40.0000 mg | ORAL_TABLET | Freq: Every day | ORAL | 0 refills | Status: AC

## 2020-11-20 MED ORDER — IBUPROFEN 800 MG PO TABS: 800.0000 mg | ORAL_TABLET | Freq: Three times a day (TID) | ORAL | 0 refills | Status: DC

## 2020-11-20 MED ORDER — PROMETHAZINE-DM 6.25-15 MG/5ML PO SYRP: 5.0000 mL | ORAL_SOLUTION | Freq: Four times a day (QID) | ORAL | 0 refills | Status: DC | PRN

## 2020-11-20 MED ORDER — BENZONATATE 100 MG PO CAPS: 100.0000 mg | ORAL_CAPSULE | Freq: Three times a day (TID) | ORAL | 0 refills | Status: DC

## 2020-11-20 NOTE — Discharge Instructions (Addendum)
-  Tessalon as needed for cough. Take one pill up to 3x daily (every 8 hours) -Promethazine DM cough syrup for congestion/cough. This could make you drowsy, so take at night before bed. -Prednisone two pills daily for five days. Try taking it in the morning as it can give you energy.  -Ibuprofen 800mg  up to 3x daily for rib pain.  -Seek immediate medical attention if you develop left-sided chest pain at rest, pain radiating down the arms, weakness in arms or legs, worst headache of life, vision changes.  -We are currently awaiting result of your PCR covid-19 test. This typically comes back in 1-2 days. We'll call you if the result is positive. Otherwise, the result will be sent electronically to your MyChart. You can also call this clinic and ask for your result via telephone.   Please isolate at home while awaiting these results. If your test is positive for Covid-19, continue to isolate at home for 5 days if you have mild symptoms, or a total of 10 days from symptom onset if you have more severe symptoms. If you quarantine for a shorter period of time (i.e. 5 days), make sure to wear a mask until day 10 of symptoms. Treat your symptoms at home with OTC remedies like tylenol/ibuprofen, mucinex, nyquil, etc. Seek medical attention if you develop high fevers, chest pain, shortness of breath, ear pain, facial pain, etc. Make sure to get up and move around every 2-3 hours while convalescing to help prevent blood clots. Drink plenty of fluids, and rest as much as possible.

## 2020-11-20 NOTE — ED Triage Notes (Signed)
Pt reports Sx'[s started one week ago. Pt has HA,sore throat,and dry cough.

## 2020-11-20 NOTE — ED Provider Notes (Signed)
[facility]    CSN: [id number] Arrival date & time: 11/20/20  0913      History   Chief Complaint Chief Complaint  Patient presents with  . Headache  . Sore Throat  . Cough    HPI Alexandra Lynn is a 40 y.o. female presenting with headache, sore throat, cough.  Patient with history of abnormal uterine bleeding, breast mass right, breast mass left, HCC.  Today she is presenting for 1 week of headaches, sore throat, cough.  She states that the cough comes in bursts and this keeps her up at night.  She states that her throat feels like it scratchy.  Describes headache as intermittent and 8 out of 10.  Denies vision changes denies pain down arms denies weakness or sensation changes in arms or legs.  Denies worst headache of life.  She states that she has rib pain with coughing, worse over her sternum.  Denies chest pain at rest.  Has been trying over-the-counter cough medication without relief.  Denies history of cardiopulmonary disease, chest pain at rest, denies pain down left arm, denies jaw pain, denies nausea vomiting diarrhea.  HPI  Past Medical History:  Diagnosis Date  . Ovarian cyst 2011    Patient Active Problem List   Diagnosis Date Noted  . H/O uterine leiomyoma 12/29/2015  . Abnormal uterine bleeding (AUB) 12/15/2014  . Breast mass, right 02/28/2014  . Left breast mass 02/28/2014  . 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 laproscopic  2009   removal of fluid from ovary    OB History    Gravida  2   Para  2   Term  2   Preterm  0   AB  0   Living  2     SAB  0   IAB  0   Ectopic  0   Multiple  0   Live Births  1            Home Medications    Prior to Admission medications   Medication Sig Start Date End Date Taking? Authorizing Provider  benzonatate (TESSALON) 100 MG capsule Take 1 capsule (100 mg total) by mouth every 8 (eight) hours. 11/20/20  Yes  Hazel Sams, PA-C  ibuprofen (ADVIL) 800 MG tablet Take 1 tablet (800 mg total) by mouth 3 (three) times daily. 11/20/20  Yes Hazel Sams, PA-C  predniSONE (DELTASONE) 20 MG tablet Take 2 tablets (40 mg total) by mouth daily for 5 days. 11/20/20 11/25/20 Yes Hazel Sams, PA-C  promethazine-dextromethorphan (PROMETHAZINE-DM) 6.25-15 MG/5ML syrup Take 5 mLs by mouth 4 (four) times daily as needed for cough. 11/20/20  Yes Hazel Sams, PA-C  metroNIDAZOLE (FLAGYL) 500 MG tablet Take 1 tablet (500 mg total) by mouth 2 (two) times daily. No alcohol while taking this medication 11/01/20   [name], NP  phenazopyridine (PYRIDIUM) 200 MG tablet Take 1 tablet (200 mg total) by mouth 3 (three) times daily. 10/31/20   Burleson, Rona Ravens, NP  sulfamethoxazole-trimethoprim (BACTRIM DS) 800-160 MG tablet Take 1 tablet by mouth 2 (two) times daily. 10/31/20   Burleson, Rona Ravens, NP  medroxyPROGESTERone (DEPO-PROVERA) 150 MG/ML injection Inject 1 mL (150 mg total) into the muscle every 3 (three) months. 12/29/15 09/01/20  Morene Crocker, CNM    Family History Family History  Problem Relation Age of Onset  . Hearing loss Neg Hx   .  Anesthesia problems Neg Hx     Social History Social History   Tobacco Use  . Smoking status: Never Smoker  . Smokeless tobacco: Never Used  Substance Use Topics  . Alcohol use: No    Alcohol/week: 0.0 standard drinks  . Drug use: No     Allergies   Patient has no known allergies.   Review of Systems Review of Systems  Constitutional: Negative for appetite change, chills and fever.  HENT: Positive for congestion and sore throat. Negative for ear pain, rhinorrhea, sinus pressure and sinus pain.   Eyes: Negative for redness and visual disturbance.  Respiratory: Positive for cough. Negative for chest tightness, shortness of breath and wheezing.        Chest wall pain with coughing  Cardiovascular: Negative for chest pain and palpitations.   Gastrointestinal: Negative for abdominal pain, constipation, diarrhea, nausea and vomiting.  Genitourinary: Negative for dysuria, frequency and urgency.  Musculoskeletal: Negative for myalgias.  Neurological: Positive for headaches. Negative for dizziness and weakness.  Psychiatric/Behavioral: Negative for confusion.  All other systems reviewed and are negative.    Physical Exam Triage Vital Signs ED Triage Vitals  Enc Vitals Group     BP 11/20/20 1010 108/73     Pulse Rate 11/20/20 1010 83     Resp 11/20/20 1010 18     Temp 11/20/20 1010 (!) 97.5 F (36.4 C)     Temp Source 11/20/20 1010 Oral     SpO2 11/20/20 1010 98 %     Weight --      Height --      Head Circumference --      Peak Flow --      Pain Score 11/20/20 1012 8     Pain Loc --      Pain Edu? --      Excl. in [place]? --    No data found.  Updated Vital Signs BP 108/73 (BP Location: Right Arm)   Pulse 83   Temp (!) 97.5 F (36.4 C) (Oral)   Resp 18   LMP 11/20/2020   SpO2 98%   Visual Acuity Right Eye Distance:   Left Eye Distance:   Bilateral Distance:    Right Eye Near:   Left Eye Near:    Bilateral Near:     Physical Exam Vitals reviewed.  Constitutional:      General: She is not in acute distress.    Appearance: Normal appearance. She is not ill-appearing.  HENT:     Head: Normocephalic and atraumatic.     Right Ear: Hearing, tympanic membrane, ear canal and external ear normal. No swelling or tenderness. There is no impacted cerumen. No mastoid tenderness. Tympanic membrane is not perforated, erythematous, retracted or bulging.     Left Ear: Hearing, tympanic membrane, ear canal and external ear normal. No swelling or tenderness. There is no impacted cerumen. No mastoid tenderness. Tympanic membrane is not perforated, erythematous, retracted or bulging.     Nose:     Right Sinus: No maxillary sinus tenderness or frontal sinus tenderness.     Left Sinus: No maxillary sinus tenderness or  frontal sinus tenderness.     Mouth/Throat:     Mouth: Mucous membranes are moist.     Pharynx: Uvula midline. No oropharyngeal exudate or posterior oropharyngeal erythema.     Tonsils: No tonsillar exudate.  Cardiovascular:     Rate and Rhythm: Normal rate and regular rhythm.     Heart sounds: Normal heart sounds.  Pulmonary:     Effort: Pulmonary effort is normal.     Breath sounds: Normal air entry. Decreased breath sounds present. No wheezing, rhonchi or rales.     Comments: Frequently coughing Chest:     Chest wall: Tenderness present.     Comments: Tenderness to palpation along lower sternal border. Abdominal:     General: Abdomen is flat. Bowel sounds are normal.     Tenderness: There is no abdominal tenderness. There is no guarding or rebound.  Musculoskeletal:     Cervical back: Normal range of motion and neck supple. No rigidity.  Lymphadenopathy:     Cervical: No cervical adenopathy.  Neurological:     General: No focal deficit present.     Mental Status: She is alert and oriented to person, place, and time. Mental status is at baseline.     Cranial Nerves: Cranial nerves are intact. No cranial nerve deficit or facial asymmetry.     Sensory: Sensation is intact. No sensory deficit.     Motor: Motor function is intact. No weakness.     Coordination: Coordination is intact. Coordination normal.     Gait: Gait is intact. Gait normal.     Comments: CN 2-12 intact. No weakness or numbness in UEs or LEs.  Psychiatric:        Attention and Perception: Attention and perception normal.        Mood and Affect: Mood and affect normal.        Behavior: Behavior normal. Behavior is cooperative.        Thought Content: Thought content normal.        Judgment: Judgment normal.      UC Treatments / Results  Labs (all labs ordered are listed, but only abnormal results are displayed) Labs Reviewed  SARS CORONAVIRUS 2 (TAT 6-24 HRS)    EKG   Radiology No results  found.  Procedures Procedures (including critical care time)  Medications Ordered in UC Medications - No data to display  Initial Impression / Assessment and Plan / UC Course  I have reviewed the triage vital signs and the nursing notes.  Pertinent labs & imaging results that were available during my care of the patient were reviewed by me and considered in my medical decision making (see chart for details).     For bronchitis, Tessalon, Promethazine DM, prednisone as below.  Patient is not a diabetic.   For costochondritis ibuprofen 800 mg up to 3 times daily with food. Head straight to ED if left-sided chest pain at rest, pain down left arm, etc.  Headaches, also ibuprofen as below.  Head straight to ER if worst headache of life, weakness or sensation changes in arms or legs, vision changes.  Covid test sent today.Isolation precautions per CDC guidelines until negative result. Symptomatic relief with OTC Mucinex, Nyquil, etc. Return precautions- new/worsening fevers/chills, shortness of breath, chest pain, abd pain, etc.   Spent over 40 minutes obtaining H&P, performing physical, discussing results, treatment plan and plan for follow-up with patient. Patient agrees with plan.   This chart was dictated using voice recognition software, Dragon. Despite the best efforts of this provider to proofread and correct errors, errors may still occur which can change documentation meaning.   Final Clinical Impressions(s) / UC Diagnoses   Final diagnoses:  Acute bronchitis, unspecified organism  Encounter for screening for COVID-19  Costochondritis  Acute non intractable tension-type headache     Discharge Instructions     -Tessalon as needed  for cough. Take one pill up to 3x daily (every 8 hours) -Promethazine DM cough syrup for congestion/cough. This could make you drowsy, so take at night before bed. -Prednisone two pills daily for five days. Try taking it in the morning as it can  give you energy.  -Ibuprofen 800mg  up to 3x daily for rib pain.  -Seek immediate medical attention if you develop left-sided chest pain at rest, pain radiating down the arms, weakness in arms or legs, worst headache of life, vision changes.  -We are currently awaiting result of your PCR covid-19 test. This typically comes back in 1-2 days. We'll call you if the result is positive. Otherwise, the result will be sent electronically to your MyChart. You can also call this clinic and ask for your result via telephone.   Please isolate at home while awaiting these results. If your test is positive for Covid-19, continue to isolate at home for 5 days if you have mild symptoms, or a total of 10 days from symptom onset if you have more severe symptoms. If you quarantine for a shorter period of time (i.e. 5 days), make sure to wear a mask until day 10 of symptoms. Treat your symptoms at home with OTC remedies like tylenol/ibuprofen, mucinex, nyquil, etc. Seek medical attention if you develop high fevers, chest pain, shortness of breath, ear pain, facial pain, etc. Make sure to get up and move around every 2-3 hours while convalescing to help prevent blood clots. Drink plenty of fluids, and rest as much as possible.    ED Prescriptions    Medication Sig Dispense Auth. Provider   benzonatate (TESSALON) 100 MG capsule Take 1 capsule (100 mg total) by mouth every 8 (eight) hours. 21 capsule Hazel Sams, PA-C   promethazine-dextromethorphan (PROMETHAZINE-DM) 6.25-15 MG/5ML syrup Take 5 mLs by mouth 4 (four) times daily as needed for cough. 118 mL Hazel Sams, PA-C   predniSONE (DELTASONE) 20 MG tablet Take 2 tablets (40 mg total) by mouth daily for 5 days. 10 tablet Hazel Sams, PA-C   ibuprofen (ADVIL) 800 MG tablet Take 1 tablet (800 mg total) by mouth 3 (three) times daily. 21 tablet Hazel Sams, PA-C     PDMP not reviewed this encounter.   Hazel Sams, PA-C 11/20/20 1047

## 2021-02-23 ENCOUNTER — Other Ambulatory Visit: Payer: Self-pay

## 2021-02-23 ENCOUNTER — Other Ambulatory Visit (HOSPITAL_COMMUNITY)
Admission: RE | Admit: 2021-02-23 | Discharge: 2021-02-23 | Disposition: A | Discharge status: Home / Self Care | Payer: Medicaid Other | Source: Ambulatory Visit

## 2021-02-23 ENCOUNTER — Ambulatory Visit: Payer: Medicaid Other

## 2021-02-23 VITALS — BP 143/88 | HR 95 | Ht 61.0 in | Wt 122.0 lb

## 2021-02-23 DIAGNOSIS — R102 Pelvic and perineal pain: Secondary | ICD-10-CM | POA: Diagnosis present

## 2021-02-23 DIAGNOSIS — K5900 Constipation, unspecified: Secondary | ICD-10-CM

## 2021-02-23 DIAGNOSIS — Z789 Other specified health status: Secondary | ICD-10-CM

## 2021-02-23 NOTE — Progress Notes (Signed)
GYNECOLOGY PROBLEM OFFICE VISIT NOTE  History:  Alexandra Lynn is a 40 y.o. [id number] here today for pelvic pain.  She states the pain started in March and is daily. She states the pain is in her vagina and in her abdomen.  She describes the pain as sharp, intermittent, but lasts up to 15 minutes with every incident. She states she takes tylenol without relief.    She denies any abnormal vaginal discharge, bleeding, or problems with urination.  She reports pain/discomfort during sex reporting "it hurts in the inside." She states that her periods have been normal, but heavier.    Past Medical History:  Diagnosis Date  . Ovarian cyst 2011    Past Surgical History:  Procedure Laterality Date  . laproscopic  2009   removal of fluid from ovary    The following portions of the patient's history were reviewed and updated as appropriate: allergies, current medications, past family history, past medical history, past social history, past surgical history and problem list.   Health Maintenance:  Normal pap and negative HRHPV on Jan 2022.  Normal mammogram on 2017.   Review of Systems:  Genito-Urinary ROS: no dysuria, trouble voiding, or hematuria Gastrointestinal ROS: positive for - constipation-Last BM yesterday. No difficulty with passage.  Objective:  Vitals: BP (!) 143/88 (BP Location: Right Arm, Patient Position: Sitting, Cuff Size: Normal)   Pulse 95   Ht 5\' 1"  (1.549 m)   Wt 122 lb (55.3 kg)   LMP 01/30/2021 (Exact Date)   BMI 23.05 kg/m   Physical Exam: Physical Exam Constitutional:      Appearance: Normal appearance.  Genitourinary:     Right Labia: No rash or tenderness.    Left Labia: No tenderness or rash.    Vaginal discharge present.     No vaginal bleeding.     Vaginal exam comments: Moderate amt thick white discharge CV Collected.      Right Adnexa: not palpable.    Left Adnexa: not palpable.    Cervix is parous.     No cervical motion tenderness, discharge,  friability, polyp or nabothian cyst.     Uterus is not enlarged or tender.     No urethral tenderness present.  HENT:     Head: Normocephalic and atraumatic.  Eyes:     Conjunctiva/sclera: Conjunctivae normal.  Cardiovascular:     Rate and Rhythm: Normal rate.  Pulmonary:     Effort: Pulmonary effort is normal. No respiratory distress.  Abdominal:     General: Bowel sounds are normal.     Palpations: Abdomen is soft.     Tenderness: There is abdominal tenderness.  Musculoskeletal:     Cervical back: Normal range of motion.  Neurological:     Mental Status: She is alert and oriented to person, place, and time.  Skin:    General: Skin is warm and dry.  Psychiatric:        Mood and Affect: Mood normal.        Behavior: Behavior normal.        Thought Content: Thought content normal.  Vitals reviewed. Exam conducted with a chaperone present.      Labs and Imaging: No results found.  Assessment & Plan:  40 year old Pelvic Pain Language Barrier Constipation   -Discussed pelvic exam with STD and vaginal infection testing. -Reviewed how these can contribute to pelvic pain. -Also discussed how pelvic pain can be due to constipation. -Reviewed how stress can cause some changes  in menstrual cycle as well as age. -Encouraged to take OTC stool softener to regulate bowel movements. -Informed that order/request for pelvic US will be sent. -Instructed to take tylenol as needed for pain. -Will collect UC today and patient with similar complaint in the past and was r/t UTI.  -Will plan to follow up in 3 months if pain persists. -Discussed need for mammogram d/t age. Patient states it is ordered, but she had to reschedule. Darnelle Maffucci [phone] used for interpretations via video session.   Total face-to-face time with patient: 20 minutes Additional 5 minutes was spent reviewing chart including previous visits and results. Total time spent 25 minutes   Gavin Pound, [place] 02/23/2021 10:42  AM

## 2021-02-23 NOTE — Patient Instructions (Signed)
Pelvic Pain, Female Pelvic pain is pain in your lower abdomen, below your belly button and between your hips. The pain may start suddenly (be acute), keep coming back (be recurring), or last a long time (become chronic). Pelvic pain that lasts longer than 6 months is considered chronic. Pelvic pain may affect your:  Reproductive organs.  Urinary system.  Digestive tract.  Musculoskeletal system. There are many potential causes of pelvic pain. Sometimes, the pain can be a result of digestive or urinary conditions, strained muscles or ligaments, or reproductive conditions. Sometimes the cause of pelvic pain is not known. Follow these instructions at home:  Take over-the-counter and prescription medicines only as told by your health care provider.  Rest as told by your health care provider.  Do not have sex if it hurts.  Keep a journal of your pelvic pain. Write down: ? When the pain started. ? Where the pain is located. ? What seems to make the pain better or worse, such as food or your period (menstrual cycle). ? Any symptoms you have along with the pain.  Keep all follow-up visits as told by your health care provider. This is important.   Contact a health care provider if:  Medicine does not help your pain.  Your pain comes back.  You have new symptoms.  You have abnormal vaginal discharge or bleeding, including bleeding after menopause.  You have a fever or chills.  You are constipated.  You have blood in your urine or stool.  You have foul-smelling urine.  You feel weak or light-headed. Get help right away if:  You have sudden severe pain.  Your pain gets steadily worse.  You have severe pain along with fever, nausea, vomiting, or excessive sweating.  You lose consciousness. Summary  Pelvic pain is pain in your lower abdomen, below your belly button and between your hips.  There are many potential causes of pelvic pain.  Keep a journal of your pelvic  pain. This information is not intended to replace advice given to you by your health care provider. Make sure you discuss any questions you have with your health care provider. Document Revised: 03/18/2018 Document Reviewed: 03/18/2018 Elsevier Patient Education  2021 [employer] Inc.  

## 2021-02-23 NOTE — Progress Notes (Signed)
Pt presents today with pelvic pain that she states has been going on since March. States she has 2 periods in the month of April. One that started at the end of March and lasted 4 days and one on 01/30/21 and lasted 8 days. Pt is not currently on anything for birth control. Pt states she does have some issues with constipation.

## 2021-02-25 LAB — URINE CULTURE

## 2021-02-27 LAB — CERVICOVAGINAL ANCILLARY ONLY
Bacterial Vaginitis (gardnerella): POSITIVE — AB
Candida Glabrata: NEGATIVE
Candida Vaginitis: NEGATIVE
Chlamydia: NEGATIVE
Comment: NEGATIVE
Comment: NEGATIVE
Comment: NEGATIVE
Comment: NEGATIVE
Comment: NEGATIVE
Comment: NORMAL
Neisseria Gonorrhea: NEGATIVE
Trichomonas: NEGATIVE

## 2021-03-02 MED ORDER — METRONIDAZOLE 0.75 % VA GEL: 1.0000 | Freq: Every day | VAGINAL | 1 refills | Status: DC

## 2021-03-02 NOTE — Addendum Note (Signed)
Addended by: Gavin Pound L on: 03/02/2021 10:29 PM   Modules accepted: Orders

## 2021-03-26 ENCOUNTER — Other Ambulatory Visit: Payer: Self-pay

## 2021-03-26 ENCOUNTER — Ambulatory Visit (HOSPITAL_COMMUNITY)
Admission: RE | Admit: 2021-03-26 | Discharge: 2021-03-26 | Disposition: A | Discharge status: Home / Self Care | Payer: Medicaid Other | Source: Ambulatory Visit

## 2021-03-26 DIAGNOSIS — R102 Pelvic and perineal pain: Secondary | ICD-10-CM | POA: Insufficient documentation

## 2021-03-28 ENCOUNTER — Other Ambulatory Visit: Payer: Self-pay | Admitting: Internal Medicine

## 2021-03-28 DIAGNOSIS — Z09 Encounter for follow-up examination after completed treatment for conditions other than malignant neoplasm: Secondary | ICD-10-CM

## 2021-03-29 ENCOUNTER — Telehealth: Payer: Self-pay

## 2021-03-29 NOTE — Telephone Encounter (Signed)
-----   Message from [name], NP sent at 03/28/2021  8:35 AM EDT ----- Ultrasound reviewed.  Some adenoymosis seen which is a small area.  Unsure if this is the reason she is having pain.  Jessica's note said to follow up in 3 months but I do not see another appointment for her.  May want to schedule in one month WITH MD as not other infection was not found.  Will need interpreter to call.

## 2021-03-29 NOTE — Telephone Encounter (Signed)
-----   Message from [name], NP sent at 03/28/2021  8:35 AM EDT ----- Ultrasound reviewed.  Some adenoymosis seen which is a small area.  Unsure if this is the reason she is having pain.  Alexandra Lynn's note said to follow up in 3 months but I do not see another appointment for her.  May want to schedule in one month WITH MD as not other infection was not found.  Will need interpreter to call.

## 2021-03-29 NOTE — Telephone Encounter (Signed)
Call patient to inform her of test results.

## 2021-03-29 NOTE — Telephone Encounter (Signed)
Attempted to call back regarding test results. No answer or voice mail to leave a message.

## 2021-04-26 ENCOUNTER — Encounter: Payer: Self-pay | Admitting: Obstetrics and Gynecology

## 2021-04-26 ENCOUNTER — Other Ambulatory Visit: Payer: Self-pay

## 2021-04-26 ENCOUNTER — Ambulatory Visit (INDEPENDENT_AMBULATORY_CARE_PROVIDER_SITE_OTHER): Payer: Medicaid Other | Admitting: Obstetrics and Gynecology

## 2021-04-26 VITALS — BP 120/76 | HR 93 | Ht 61.0 in | Wt 126.2 lb

## 2021-04-26 DIAGNOSIS — N8003 Adenomyosis of the uterus: Secondary | ICD-10-CM

## 2021-04-26 DIAGNOSIS — N6310 Unspecified lump in the right breast, unspecified quadrant: Secondary | ICD-10-CM | POA: Diagnosis not present

## 2021-04-26 DIAGNOSIS — N800 Endometriosis of uterus: Secondary | ICD-10-CM

## 2021-04-26 NOTE — Patient Instructions (Signed)
Health Maintenance, Female Adopting a healthy lifestyle and getting preventive care are important in promoting health and wellness. Ask your health care provider about: The right schedule for you to have regular tests and exams. Things you can do on your own to prevent diseases and keep yourself healthy. What should I know about diet, weight, and exercise? Eat a healthy diet  Eat a diet that includes plenty of vegetables, fruits, low-fat dairy products, and lean protein. Do not eat a lot of foods that are high in solid fats, added sugars, or sodium.  Maintain a healthy weight Body mass index (BMI) is used to identify weight problems. It estimates body fat based on height and weight. Your health care provider can help determineyour BMI and help you achieve or maintain a healthy weight. Get regular exercise Get regular exercise. This is one of the most important things you can do for your health. Most adults should: Exercise for at least 150 minutes each week. The exercise should increase your heart rate and make you sweat (moderate-intensity exercise). Do strengthening exercises at least twice a week. This is in addition to the moderate-intensity exercise. Spend less time sitting. Even light physical activity can be beneficial. Watch cholesterol and blood lipids Have your blood tested for lipids and cholesterol at 42 years of age, then havethis test every 5 years. Have your cholesterol levels checked more often if: Your lipid or cholesterol levels are high. You are older than 42 years of age. You are at high risk for heart disease. What should I know about cancer screening? Depending on your health history and family history, you may need to have cancer screening at various ages. This may include screening for: Breast cancer. Cervical cancer. Colorectal cancer. Skin cancer. Lung cancer. What should I know about heart disease, diabetes, and high blood pressure? Blood pressure and heart  disease High blood pressure causes heart disease and increases the risk of stroke. This is more likely to develop in people who have high blood pressure readings, are of African descent, or are overweight. Have your blood pressure checked: Every 3-5 years if you are 18-39 years of age. Every year if you are 40 years old or older. Diabetes Have regular diabetes screenings. This checks your fasting blood sugar level. Have the screening done: Once every three years after age 40 if you are at a normal weight and have a low risk for diabetes. More often and at a younger age if you are overweight or have a high risk for diabetes. What should I know about preventing infection? Hepatitis B If you have a higher risk for hepatitis B, you should be screened for this virus. Talk with your health care provider to find out if you are at risk forhepatitis B infection. Hepatitis C Testing is recommended for: Everyone born from 1945 through 1965. Anyone with known risk factors for hepatitis C. Sexually transmitted infections (STIs) Get screened for STIs, including gonorrhea and chlamydia, if: You are sexually active and are younger than 42 years of age. You are older than 42 years of age and your health care provider tells you that you are at risk for this type of infection. Your sexual activity has changed since you were last screened, and you are at increased risk for chlamydia or gonorrhea. Ask your health care provider if you are at risk. Ask your health care provider about whether you are at high risk for HIV. Your health care provider may recommend a prescription medicine to help   prevent HIV infection. If you choose to take medicine to prevent HIV, you should first get tested for HIV. You should then be tested every 3 months for as long as you are taking the medicine. Pregnancy If you are about to stop having your period (premenopausal) and you may become pregnant, seek counseling before you get  pregnant. Take 400 to 800 micrograms (mcg) of folic acid every day if you become pregnant. Ask for birth control (contraception) if you want to prevent pregnancy. Osteoporosis and menopause Osteoporosis is a disease in which the bones lose minerals and strength with aging. This can result in bone fractures. If you are 65 years old or older, or if you are at risk for osteoporosis and fractures, ask your health care provider if you should: Be screened for bone loss. Take a calcium or vitamin D supplement to lower your risk of fractures. Be given hormone replacement therapy (HRT) to treat symptoms of menopause. Follow these instructions at home: Lifestyle Do not use any products that contain nicotine or tobacco, such as cigarettes, e-cigarettes, and chewing tobacco. If you need help quitting, ask your health care provider. Do not use street drugs. Do not share needles. Ask your health care provider for help if you need support or information about quitting drugs. Alcohol use Do not drink alcohol if: Your health care provider tells you not to drink. You are pregnant, may be pregnant, or are planning to become pregnant. If you drink alcohol: Limit how much you use to 0-1 drink a day. Limit intake if you are breastfeeding. Be aware of how much alcohol is in your drink. In the [place] one drink equals one 12 oz bottle of beer (355 mL), one 5 oz glass of wine (148 mL), or one 1 oz glass of hard liquor (44 mL). General instructions Schedule regular health, dental, and eye exams. Stay current with your vaccines. Tell your health care provider if: You often feel depressed. You have ever been abused or do not feel safe at home. Summary Adopting a healthy lifestyle and getting preventive care are important in promoting health and wellness. Follow your health care provider's instructions about healthy diet, exercising, and getting tested or screened for diseases. Follow your health care provider's  instructions on monitoring your cholesterol and blood pressure. This information is not intended to replace advice given to you by your health care provider. Make sure you discuss any questions you have with your healthcare provider. Document Revised: 09/23/2018 Document Reviewed: 09/23/2018 Elsevier Patient Education  2022 Elsevier Inc.  

## 2021-04-27 NOTE — Progress Notes (Signed)
Alexandra Lynn presents for U/S results follow up for pelvic pain. GYN U/S results reviewed with pt. Normal except for possible adenomyosis. Pt reports pain has resolved and her cycles are regular now.  PE AF VSS Lungs clear Heart RRR Abd soft + BS  A/P Adenomyosis        Breast mass  Pt instructed to keep a menses calendar. Motrin/Tylenol as need. Pt reports that she has a mammogram schedule August. F/U PRN Video interrupter was used during today's visit

## 2021-05-16 ENCOUNTER — Other Ambulatory Visit: Payer: Self-pay | Admitting: Internal Medicine

## 2021-05-16 DIAGNOSIS — N63 Unspecified lump in unspecified breast: Secondary | ICD-10-CM

## 2021-05-17 ENCOUNTER — Other Ambulatory Visit: Payer: Self-pay | Admitting: Obstetrics and Gynecology

## 2021-05-17 DIAGNOSIS — N63 Unspecified lump in unspecified breast: Secondary | ICD-10-CM

## 2021-05-18 ENCOUNTER — Ambulatory Visit: Payer: Medicaid Other

## 2021-05-18 ENCOUNTER — Other Ambulatory Visit: Payer: Self-pay

## 2021-05-18 ENCOUNTER — Ambulatory Visit
Admission: RE | Admit: 2021-05-18 | Discharge: 2021-05-18 | Disposition: A | Discharge status: Home / Self Care | Payer: Medicaid Other | Source: Ambulatory Visit | Attending: Internal Medicine | Admitting: Internal Medicine

## 2021-05-18 DIAGNOSIS — N63 Unspecified lump in unspecified breast: Secondary | ICD-10-CM

## 2022-01-25 ENCOUNTER — Other Ambulatory Visit (HOSPITAL_COMMUNITY)
Admission: RE | Admit: 2022-01-25 | Discharge: 2022-01-25 | Disposition: A | Discharge status: Home / Self Care | Payer: Medicaid Other | Source: Ambulatory Visit | Attending: Obstetrics | Admitting: Obstetrics

## 2022-01-25 ENCOUNTER — Encounter: Payer: Self-pay | Admitting: Obstetrics

## 2022-01-25 ENCOUNTER — Ambulatory Visit: Payer: Medicaid Other | Admitting: Obstetrics

## 2022-01-25 VITALS — BP 128/90 | HR 90 | Ht 62.0 in | Wt 125.5 lb

## 2022-01-25 DIAGNOSIS — N898 Other specified noninflammatory disorders of vagina: Secondary | ICD-10-CM | POA: Diagnosis present

## 2022-01-25 DIAGNOSIS — Z113 Encounter for screening for infections with a predominantly sexual mode of transmission: Secondary | ICD-10-CM

## 2022-01-25 DIAGNOSIS — Z01419 Encounter for gynecological examination (general) (routine) without abnormal findings: Secondary | ICD-10-CM

## 2022-01-25 DIAGNOSIS — N912 Amenorrhea, unspecified: Secondary | ICD-10-CM | POA: Diagnosis not present

## 2022-01-25 DIAGNOSIS — R102 Pelvic and perineal pain: Secondary | ICD-10-CM | POA: Diagnosis not present

## 2022-01-25 LAB — POCT URINALYSIS DIPSTICK
Bilirubin, UA: NEGATIVE
Blood, UA: NEGATIVE
Glucose, UA: NEGATIVE
Ketones, UA: NEGATIVE
Leukocytes, UA: NEGATIVE
Nitrite, UA: NEGATIVE
Odor: NEGATIVE
Protein, UA: NEGATIVE
Spec Grav, UA: 1.01 (ref 1.010–1.025)
Urobilinogen, UA: 0.2 E.U./dL
pH, UA: 7 (ref 5.0–8.0)

## 2022-01-25 LAB — POCT URINE PREGNANCY: Preg Test, Ur: NEGATIVE

## 2022-01-25 MED ORDER — MEDROXYPROGESTERONE ACETATE 10 MG PO TABS
10.0000 mg | ORAL_TABLET | Freq: Every day | ORAL | 0 refills | Status: DC
Start: 2022-01-25 — End: 2022-03-14

## 2022-01-25 NOTE — Progress Notes (Signed)
? ?Subjective: ? ? ?  ?  ? Alexandra Lynn is a 41 y.o. female here for a routine exam.  Current complaints: No period for 4 months..   ? ?Personal health questionnaire:  ?Is patient Ashkenazi Jewish, have a family history of breast and/or ovarian cancer: no ?Is there a family history of uterine cancer diagnosed at age < 67, gastrointestinal cancer, urinary tract cancer, family member who is a Field seismologist syndrome-associated carrier: no ?Is the patient overweight and hypertensive, family history of diabetes, personal history of gestational diabetes, preeclampsia or PCOS: no ?Is patient over 64, have PCOS,  family history of premature CHD under age 3, diabetes, smoke, have hypertension or peripheral artery disease:  no ?At any time, has a partner hit, kicked or otherwise hurt or frightened you?: no ?Over the past 2 weeks, have you felt down, depressed or hopeless?: no ?Over the past 2 weeks, have you felt little interest or pleasure in doing things?:no ? ? ?Gynecologic History ?Patient's last menstrual period was 10/03/2021 (within days). ?Contraception: none ?Last Pap: 10-31-2020. Results were: normal ?Last mammogram: 05-18-2021. Results were: normal ? ?Obstetric History ?OB History  ?Gravida Para Term Preterm AB Living  ?'2 2 2 '$ 0 0 2  ?SAB IAB Ectopic Multiple Live Births  ?0 0 0 0 1  ?  ?# Outcome Date GA Lbr Len/2nd Weight Sex Delivery Anes PTL Lv  ?2 Term 08/29/12 [id number]:00 / 02:42 7 lb 10.6 oz (3.475 kg) F Vag-Spont EPI  LIV  ?   Birth Comments: none  ?1 Term           ? ? ?Past Medical History:  ?Diagnosis Date  ? Ovarian cyst 2011  ?  ?Past Surgical History:  ?Procedure Laterality Date  ? laproscopic  2009  ? removal of fluid from ovary  ?  ? ?Current Outpatient Medications:  ?  medroxyPROGESTERone (PROVERA) 10 MG tablet, Take 1 tablet (10 mg total) by mouth daily., Disp: 10 tablet, Rfl: 0 ?  atorvastatin (LIPITOR) 40 MG tablet, Take 40 mg by mouth at bedtime., Disp: , Rfl:  ?  omeprazole (PRILOSEC) 40 MG capsule,  Take 40 mg by mouth daily., Disp: , Rfl:  ?No Known Allergies  ?Social History  ? ?Tobacco Use  ? Smoking status: Never  ?  Passive exposure: Never  ? Smokeless tobacco: Never  ?Substance Use Topics  ? Alcohol use: No  ?  Alcohol/week: 0.0 standard drinks  ?  ?Family History  ?Problem Relation Age of Onset  ? Hearing loss Neg Hx   ? Anesthesia problems Neg Hx   ?  ? ? ?Review of Systems ? ?Constitutional: negative for fatigue and weight loss ?Respiratory: negative for cough and wheezing ?Cardiovascular: negative for chest pain, fatigue and palpitations ?Gastrointestinal: negative for abdominal pain and change in bowel habits ?Musculoskeletal:negative for myalgias ?Neurological: negative for gait problems and tremors ?Behavioral/Psych: negative for abusive relationship, depression ?Endocrine: negative for temperature intolerance    ?Genitourinary:positive for no period for 4 months., negative for genital lesions, hot flashes, sexual problems and vaginal discharge ?Integument/breast: negative for breast lump, breast tenderness, nipple discharge and skin lesion(s) ? ?  ?Objective:  ? ?    ?BP 128/90   Pulse 90   Ht '5\' 2"'$  (1.575 m)   Wt 125 lb 8 oz (56.9 kg)   LMP 10/03/2021 (Within Days)   BMI 22.95 kg/m?  ?General:   Alert and no distress  ?Skin:   no rash or abnormalities  ?Lungs:  clear to auscultation bilaterally  ?Heart:   regular rate and rhythm, S1, S2 normal, no murmur, click, rub or gallop  ?Breasts:   normal without suspicious masses, skin or nipple changes or axillary nodes  ?Abdomen:  normal findings: no organomegaly, soft, non-tender and no hernia  ?Pelvis:  External genitalia: normal general appearance ?Urinary system: urethral meatus normal and bladder without fullness, nontender ?Vaginal: normal without tenderness, induration or masses ?Cervix: normal appearance ?Adnexa: normal bimanual exam ?Uterus: anteverted and non-tender, normal size  ? ?Lab Review ?Urine pregnancy test ?Labs reviewed  yes ?Radiologic studies reviewed yes ? ?I have spent a total of 20 minutes of face-to-face time, excluding clinical staff time, reviewing notes and preparing to see patient, ordering tests and/or medications, and counseling the patient.  ? ?Assessment:  ? ? 1. Encounter for gynecological examination with Papanicolaou smear of cervix ?Rx: ?- Cytology - PAP( [facility]) ? ?2. Amenorrhea ?Rx: ?- medroxyPROGESTERone (PROVERA) 10 MG tablet; Take 1 tablet (10 mg total) by mouth daily.  Dispense: 10 tablet; Refill: 0 ?: ?3. Pelvic pain ?Rx: ?- POCT urine pregnancy ?- POCT Urinalysis Dipstick ?- US PELVIC COMPLETE WITH TRANSVAGINAL; Future ? ?4. Vaginal discharge ?Rx: ?- Cervicovaginal ancillary only( [facility]) ? ?5. Screen for STD (sexually transmitted disease) ?Rx: ?- Hepatitis B surface antigen ?- Hepatitis C antibody ?- RPR ?- HIV Antibody (routine testing w rflx)  ?  ? ?Plan:  ? ? Education reviewed: calcium supplements, depression evaluation, low fat, low cholesterol diet, safe sex/STD prevention, self breast exams, and weight bearing exercise. ?Follow up in: 2 weeks.  ? ?Meds ordered this encounter  ?Medications  ? medroxyPROGESTERone (PROVERA) 10 MG tablet  ?  Sig: Take 1 tablet (10 mg total) by mouth daily.  ?  Dispense:  10 tablet  ?  Refill:  0  ? ?Orders Placed This Encounter  ?Procedures  ? US PELVIC COMPLETE WITH TRANSVAGINAL  ?  Standing Status:   Future  ?  Standing Expiration Date:   01/26/2023  ?  Order Specific Question:   Reason for Exam (SYMPTOM  OR DIAGNOSIS REQUIRED)  ?  Answer:   Amenorrhea.  Pelvic pain.  ?  Order Specific Question:   Preferred imaging location?  ?  Answer:   [facility]  ? Hepatitis B surface antigen  ? Hepatitis C antibody  ? RPR  ? HIV Antibody (routine testing w rflx)  ? POCT urine pregnancy  ? POCT Urinalysis Dipstick  ? ? ? Shelly Bombard, MD ?01/26/2022 11:02 AM  ?

## 2022-01-25 NOTE — Progress Notes (Signed)
Pt presents for annual c/o pelvic pain and no menses x 4 months. ?Pt requests all STD testing.  ?Used [place] interpreter [phone] ?

## 2022-01-26 LAB — HEPATITIS B SURFACE ANTIGEN: Hepatitis B Surface Ag: NEGATIVE

## 2022-01-26 LAB — HEPATITIS C ANTIBODY: Hep C Virus Ab: NONREACTIVE

## 2022-01-26 LAB — HIV ANTIBODY (ROUTINE TESTING W REFLEX): HIV Screen 4th Generation wRfx: NONREACTIVE

## 2022-01-26 LAB — RPR: RPR Ser Ql: NONREACTIVE

## 2022-01-28 ENCOUNTER — Other Ambulatory Visit: Payer: Self-pay | Admitting: Obstetrics

## 2022-01-28 DIAGNOSIS — B9689 Other specified bacterial agents as the cause of diseases classified elsewhere: Secondary | ICD-10-CM

## 2022-01-28 LAB — CERVICOVAGINAL ANCILLARY ONLY
Bacterial Vaginitis (gardnerella): POSITIVE — AB
Candida Glabrata: NEGATIVE
Candida Vaginitis: NEGATIVE
Chlamydia: NEGATIVE
Comment: NEGATIVE
Comment: NEGATIVE
Comment: NEGATIVE
Comment: NEGATIVE
Comment: NEGATIVE
Comment: NORMAL
Neisseria Gonorrhea: NEGATIVE
Trichomonas: NEGATIVE

## 2022-01-28 MED ORDER — METRONIDAZOLE 500 MG PO TABS: 500.0000 mg | ORAL_TABLET | Freq: Two times a day (BID) | ORAL | 2 refills | Status: DC

## 2022-01-31 LAB — CYTOLOGY - PAP
Comment: NEGATIVE
Comment: NEGATIVE
Comment: NEGATIVE
Diagnosis: UNDETERMINED — AB
HPV 16: NEGATIVE
HPV 18 / 45: NEGATIVE
High risk HPV: POSITIVE — AB

## 2022-02-01 ENCOUNTER — Ambulatory Visit (HOSPITAL_BASED_OUTPATIENT_CLINIC_OR_DEPARTMENT_OTHER)
Admission: RE | Admit: 2022-02-01 | Discharge: 2022-02-01 | Disposition: A | Discharge status: Home / Self Care | Payer: Medicaid Other | Source: Ambulatory Visit | Attending: Obstetrics | Admitting: Obstetrics

## 2022-02-01 DIAGNOSIS — R102 Pelvic and perineal pain: Secondary | ICD-10-CM | POA: Insufficient documentation

## 2022-02-08 ENCOUNTER — Encounter: Payer: Self-pay | Admitting: Obstetrics

## 2022-02-08 ENCOUNTER — Ambulatory Visit (INDEPENDENT_AMBULATORY_CARE_PROVIDER_SITE_OTHER): Payer: Medicaid Other | Admitting: Obstetrics

## 2022-02-08 ENCOUNTER — Telehealth: Payer: Self-pay

## 2022-02-08 DIAGNOSIS — R102 Pelvic and perineal pain: Secondary | ICD-10-CM

## 2022-02-08 DIAGNOSIS — R87810 Cervical high risk human papillomavirus (HPV) DNA test positive: Secondary | ICD-10-CM

## 2022-02-08 DIAGNOSIS — R87610 Atypical squamous cells of undetermined significance on cytologic smear of cervix (ASC-US): Secondary | ICD-10-CM | POA: Diagnosis not present

## 2022-02-08 NOTE — Progress Notes (Addendum)
? ?TELEHEALTH GYNECOLOGY VISIT ENCOUNTER NOTE ? ?Provider location: [facility] [place]  ? ?Patient location: Home ? ?I connected with Alexandra Lynn on 02/08/22 at 10:35 AM EDT by telephone and verified that I am speaking with the correct person using two identifiers. Patient was unable to do MyChart audiovisual encounter due to technical difficulties, she tried several times.  ?  ?I discussed the limitations, risks, security and privacy concerns of performing an evaluation and management service by telephone and the availability of in person appointments. I also discussed with the patient that there may be a patient responsible charge related to this service. The patient expressed understanding and agreed to proceed. ?  ?History:  ?Alexandra Lynn is a 41 y.o. [id number] female being evaluated today for pelvic pain.  Ultrasound done.  She presents today to discuss results.  She denies any abnormal vaginal discharge, bleeding, or other concerns.   ?  ?  ?Past Medical History:  ?Diagnosis Date  ? Ovarian cyst 2011  ? ?Past Surgical History:  ?Procedure Laterality Date  ? laproscopic  2009  ? removal of fluid from ovary  ? ?The following portions of the patient's history were reviewed and updated as appropriate: allergies, current medications, past family history, past medical history, past social history, past surgical history and problem list.  ? ?Health Maintenance:  ASCUS pap and positive HRHPV on 01-25-2022.  Normal mammogram on 05-18-2021.  ? ?Review of Systems:  ?Pertinent items noted in HPI and remainder of comprehensive ROS otherwise negative. ? ?Physical Exam:  ? ?General:  Alert, oriented and cooperative.   ?Mental Status: Normal mood and affect perceived. Normal judgment and thought content.  ?Physical exam deferred due to nature of the encounter ? ?Labs and Imaging ?No results found for this or any previous visit (from the past 336 hour(s)). ?US PELVIC COMPLETE WITH TRANSVAGINAL ? ?Addendum  Date: 02/06/2022   ?ADDENDUM REPORT: 02/06/2022 12:17 ADDENDUM: Pulse Doppler examination was not performed in the current study. Color Doppler examination was performed. Electronically Signed   By: Elmer Picker M.D.   On: 02/06/2022 12:17  ? ?Result Date: 02/06/2022 ?CLINICAL DATA:  Pelvic pain, amenorrhea x4 months EXAM: TRANSABDOMINAL AND TRANSVAGINAL ULTRASOUND OF PELVIS DOPPLER ULTRASOUND OF OVARIES TECHNIQUE: Both transabdominal and transvaginal ultrasound examinations of the pelvis were performed. Transabdominal technique was performed for global imaging of the pelvis including uterus, ovaries, adnexal regions, and pelvic cul-de-sac. It was necessary to proceed with endovaginal exam following the transabdominal exam to visualize the endometrium and ovaries. Color and duplex Doppler ultrasound was utilized to evaluate blood flow to the ovaries. COMPARISON:  03/26/2021 FINDINGS: Uterus Measurements: 6.3 x 4.2 x 4.3 cm = volume: 60.2 mL. Uterus is retroverted. There is inhomogeneous echogenicity in myometrium. Possibility of adenomyosis is not excluded. There is 9 x 6 mm hypoechoic structure in the fundus of the uterus, possibly small fibroid. There is another ill-defined 2 x 1.4 cm hypoechoic area in the posterior right side of fundus. Endometrium Thickness: 8.8 mm.  No focal abnormality visualized. Right ovary Measurements: 2.6 x 1.4 x 0.9 cm = volume: 1.7 mL. Normal appearance/no adnexal mass. Left ovary Measurements: 2.1 x 1 x 1.2 cm = volume: 1.3 mL. Normal appearance/no adnexal mass. Pulsed Doppler evaluation of both ovaries demonstrates normal low-resistance arterial and venous waveforms. Other findings Trace amount of free fluid is seen in the pelvis. This may be physiological due to recent rupture of ovarian follicle. IMPRESSION: Retroverted uterus with inhomogeneous echogenicity in myometrium. There  are possible small fibroids in the fundus. There is diffusely increased echogenicity in the  myometrium suggesting possible adenomyosis. Trace amount of free fluid in the pelvis may be due to physiological rupture of ovarian follicle. Electronically Signed: By: Elmer Picker M.D. On: 02/01/2022 13:10      ? ?I have spent a total of 15 minutes of non-face-to-face time, excluding clinical staff time, reviewing notes and preparing to see patient, ordering tests and/or medications, and counseling the patient.  ? ?Assessment and Plan:  ?   ?1. Pelvic pain ?- will follow clinically ?- considering surgical consultation for definitive management  ? ?2. ASCUS with positive HRHPV ?- colposcopy scheduled  ?   ?  ?I discussed the assessment and treatment plan with the patient. The patient was provided an opportunity to ask questions and all were answered. The patient agreed with the plan and demonstrated an understanding of the instructions. ?  ?The patient was advised to call back or seek an in-person evaluation/go to the ED if the symptoms worsen or if the condition fails to improve as anticipated. ? ? ? ? ?Baltazar Najjar, MD ?[facility] [place], [place], [facility] ?02/08/22  ?

## 2022-02-08 NOTE — Progress Notes (Signed)
Virtual Visit via Telephone Note ? ?I connected with Alexandra Lynn on 02/08/22 at 10:35 AM EDT by telephone and verified that I am speaking with the correct person using two identifiers. ? ?Location: ?Patient: Home ?Provider: Merla Riches  ? ?Discuss recent pelvic pain and amenorrhea x 4 month  ?  ? ?

## 2022-02-08 NOTE — Telephone Encounter (Signed)
S/w pt via interpreter and advised of pap results and need for colpo. Advised scheduler will call for appt. ?

## 2022-03-14 ENCOUNTER — Other Ambulatory Visit (HOSPITAL_COMMUNITY)
Admission: RE | Admit: 2022-03-14 | Discharge: 2022-03-14 | Disposition: A | Discharge status: Home / Self Care | Payer: No Typology Code available for payment source | Source: Ambulatory Visit | Attending: Obstetrics | Admitting: Obstetrics

## 2022-03-14 ENCOUNTER — Other Ambulatory Visit: Payer: Self-pay | Admitting: *Deleted

## 2022-03-14 ENCOUNTER — Ambulatory Visit (INDEPENDENT_AMBULATORY_CARE_PROVIDER_SITE_OTHER): Payer: No Typology Code available for payment source | Admitting: Obstetrics

## 2022-03-14 ENCOUNTER — Encounter: Payer: Self-pay | Admitting: Obstetrics

## 2022-03-14 ENCOUNTER — Other Ambulatory Visit: Payer: Self-pay | Admitting: Obstetrics

## 2022-03-14 VITALS — BP 109/71 | HR 79 | Wt 126.9 lb

## 2022-03-14 DIAGNOSIS — D508 Other iron deficiency anemias: Secondary | ICD-10-CM

## 2022-03-14 DIAGNOSIS — R87810 Cervical high risk human papillomavirus (HPV) DNA test positive: Secondary | ICD-10-CM | POA: Insufficient documentation

## 2022-03-14 DIAGNOSIS — N912 Amenorrhea, unspecified: Secondary | ICD-10-CM

## 2022-03-14 DIAGNOSIS — R87610 Atypical squamous cells of undetermined significance on cytologic smear of cervix (ASC-US): Secondary | ICD-10-CM

## 2022-03-14 DIAGNOSIS — Z01812 Encounter for preprocedural laboratory examination: Secondary | ICD-10-CM

## 2022-03-14 LAB — POCT URINE PREGNANCY: Preg Test, Ur: NEGATIVE

## 2022-03-14 MED ORDER — VITAFOL ULTRA 29-0.6-0.4-200 MG PO CAPS: 1.0000 | ORAL_CAPSULE | Freq: Every day | ORAL | 4 refills | Status: DC

## 2022-03-14 MED ORDER — ORTHO-NOVUM 1/35 (28) 1-35 MG-MCG PO TABS: 1.0000 | ORAL_TABLET | Freq: Every day | ORAL | 11 refills | Status: DC

## 2022-03-14 MED ORDER — PRENATAL GUMMIES 0.18-25 MG PO CHEW: 3.0000 | CHEWABLE_TABLET | Freq: Every day | ORAL | 12 refills | Status: DC

## 2022-03-14 MED ORDER — PRENATAL PLUS IRON 29-1 MG PO TABS: 1.0000 | ORAL_TABLET | Freq: Every day | ORAL | 12 refills | Status: DC

## 2022-03-14 NOTE — Progress Notes (Signed)
Colposcopy Procedure Note  Indications: Pap smear 1 months ago showed: ASCUS with POSITIVE high risk HPV. The prior pap showed no abnormalities.  Prior cervical/vaginal disease: normal exam without visible pathology. Prior cervical treatment: no treatment.  Procedure Details  The risks and benefits of the procedure and Written informed consent obtained.  A time-out was performed confirming the patient, procedure and allergy status  Speculum placed in vagina and excellent visualization of cervix achieved, cervix swabbed x 3 with acetic acid solution.  Findings: Cervix: no visible lesions, no mosaicism, no punctation, and no abnormal vasculature; SCJ visualized 360 degrees without lesions, Benzocaine 20% spray applied to cervix, endocervical curettage performed, cervical biopsies taken at 6 and 12 o'clock, specimen labelled and sent to pathology, and hemostasis achieved with Monsel's solution.   Vaginal inspection: normal without visible lesions. Vulvar colposcopy: vulvar colposcopy not performed.   Physical Exam   Specimens: ECC and Cervical Biopsies  Complications: none.  Plan: Specimens labelled and sent to Pathology. Will base further treatment on Pathology findings. Treatment options discussed with patient. Post biopsy instructions given to patient. Return to discuss Pathology results in 2 weeks.   Shelly Bombard, MD 03/14/2022 10:36 AM

## 2022-03-14 NOTE — Progress Notes (Signed)
Pt presents for colposcopy biopsy s/p ASCUS HR pap smear on 01/25/2022 Used [place] [id number] UPT negative

## 2022-03-14 NOTE — Addendum Note (Signed)
Addended by: Tristan Schroeder D on: 03/14/2022 02:46 PM   Modules accepted: Orders

## 2022-03-15 ENCOUNTER — Other Ambulatory Visit: Payer: Self-pay | Admitting: Obstetrics

## 2022-03-15 DIAGNOSIS — D508 Other iron deficiency anemias: Secondary | ICD-10-CM

## 2022-03-15 MED ORDER — PRENATAL PLUS IRON 29-1 MG PO TABS: 1.0000 | ORAL_TABLET | Freq: Every day | ORAL | 12 refills | Status: AC

## 2022-03-18 LAB — SURGICAL PATHOLOGY

## 2022-03-27 NOTE — Progress Notes (Signed)
2nd attempt to reach pt using [place] interpreter. No answer. HIPPA compliant VM left. MyChart message with [place] translation sent.

## 2022-04-11 ENCOUNTER — Ambulatory Visit: Payer: Medicaid Other | Admitting: Obstetrics and Gynecology

## 2022-04-19 ENCOUNTER — Ambulatory Visit: Payer: Medicaid Other | Admitting: Obstetrics

## 2022-04-25 ENCOUNTER — Ambulatory Visit: Payer: No Typology Code available for payment source | Admitting: Obstetrics & Gynecology

## 2022-04-25 ENCOUNTER — Encounter: Payer: Self-pay | Admitting: Obstetrics & Gynecology

## 2022-04-25 VITALS — BP 115/77 | HR 76 | Ht 61.0 in | Wt 123.3 lb

## 2022-04-25 DIAGNOSIS — N911 Secondary amenorrhea: Secondary | ICD-10-CM

## 2022-04-25 DIAGNOSIS — Z1231 Encounter for screening mammogram for malignant neoplasm of breast: Secondary | ICD-10-CM | POA: Diagnosis not present

## 2022-04-25 DIAGNOSIS — N870 Mild cervical dysplasia: Secondary | ICD-10-CM | POA: Diagnosis not present

## 2022-04-25 NOTE — Progress Notes (Signed)
GYNECOLOGY OFFICE VISIT NOTE  History:   Alexandra Lynn is a 41 y.o. [id number] here today for follow up of colposcopy results. Patient is [place]-speaking only, interpreter present for this encounter.  Has ASCUS +HRHPV pap on 01/25/2022, followed by colposcopy on 03/14/2022.  Also wants to follow up amenorrhea management, no period since 09/2021. Was given Provera 10 mg qd x 10 days to induce period, she stopped on Day 4 due to headaches, fatigue and not feeling well. Wants further evaluation. Of note, no lab evaluation has been done yet, pelvic ultrasound on 02/01/2022 did not show any concerning findings. She denies any abnormal vaginal discharge, bleeding, pelvic pain or other concerns.    Past Medical History:  Diagnosis Date   Ovarian cyst 2011    Past Surgical History:  Procedure Laterality Date   laproscopic  2009   removal of fluid from ovary    The following portions of the patient's history were reviewed and updated as appropriate: allergies, current medications, past family history, past medical history, past social history, past surgical history and problem list.   Health Maintenance:  Pap as above. Normal mammogram on 05/18/2021.   Review of Systems:  Pertinent items noted in HPI and remainder of comprehensive ROS otherwise negative.  Physical Exam:  BP 115/77   Pulse 76   Ht '5\' 1"'$  (1.549 m)   Wt 123 lb 4.8 oz (55.9 kg)   LMP 10/02/2021   BMI 23.30 kg/m  CONSTITUTIONAL: Well-developed, well-nourished female in no acute distress.  HEENT:  Normocephalic, atraumatic. External right and left ear normal. No scleral icterus.  NECK: Normal range of motion, supple, no masses noted on observation SKIN: No rash noted. Not diaphoretic. No erythema. No pallor. MUSCULOSKELETAL: Normal range of motion. No edema noted. NEUROLOGIC: Alert and oriented to person, place, and time. Normal muscle tone coordination. No cranial nerve deficit noted. PSYCHIATRIC: Normal mood and affect. Normal  behavior. Normal judgment and thought content. CARDIOVASCULAR: Normal heart rate noted RESPIRATORY: Effort and breath sounds normal, no problems with respiration noted ABDOMEN: No masses noted. No other overt distention noted.   PELVIC: Deferred  Studies 03/14/2022 Colposcopy Pathology Clinical History: ASCUS with positive high risk HPV (cm)  FINAL MICROSCOPIC DIAGNOSIS:  A. ENDOCERVIX, CURETTAGE:  - Benign cervical glandular mucosa  - Negative for dysplasia or malignancy  B. CERVIX, 6 AND 12 O'CLOCK, BIOPSY:  - Koilocytic atypia, consistent with low-grade squamous intraepithelial lesion (CIN1, low-grade dysplasia)    US PELVIC COMPLETE WITH TRANSVAGINAL  Addendum Date: 02/06/2022   ADDENDUM REPORT: 02/06/2022 12:17 ADDENDUM: Pulse Doppler examination was not performed in the current study. Color Doppler examination was performed. Electronically Signed   By: Elmer Picker M.D.   On: 02/06/2022 12:17   Result Date: 02/06/2022 CLINICAL DATA:  Pelvic pain, amenorrhea x4 months EXAM: TRANSABDOMINAL AND TRANSVAGINAL ULTRASOUND OF PELVIS DOPPLER ULTRASOUND OF OVARIES TECHNIQUE: Both transabdominal and transvaginal ultrasound examinations of the pelvis were performed. Transabdominal technique was performed for global imaging of the pelvis including uterus, ovaries, adnexal regions, and pelvic cul-de-sac. It was necessary to proceed with endovaginal exam following the transabdominal exam to visualize the endometrium and ovaries. Color and duplex Doppler ultrasound was utilized to evaluate blood flow to the ovaries. COMPARISON:  03/26/2021 FINDINGS: Uterus Measurements: 6.3 x 4.2 x 4.3 cm = volume: 60.2 mL. Uterus is retroverted. There is inhomogeneous echogenicity in myometrium. Possibility of adenomyosis is not excluded. There is 9 x 6 mm hypoechoic structure in the fundus of the uterus, possibly  small fibroid. There is another ill-defined 2 x 1.4 cm hypoechoic area in the posterior right side of  fundus. Endometrium Thickness: 8.8 mm.  No focal abnormality visualized. Right ovary Measurements: 2.6 x 1.4 x 0.9 cm = volume: 1.7 mL. Normal appearance/no adnexal mass. Left ovary Measurements: 2.1 x 1 x 1.2 cm = volume: 1.3 mL. Normal appearance/no adnexal mass. Pulsed Doppler evaluation of both ovaries demonstrates normal low-resistance arterial and venous waveforms. Other findings Trace amount of free fluid is seen in the pelvis. This may be physiological due to recent rupture of ovarian follicle. IMPRESSION: Retroverted uterus with inhomogeneous echogenicity in myometrium. There are possible small fibroids in the fundus. There is diffusely increased echogenicity in the myometrium suggesting possible adenomyosis. Trace amount of free fluid in the pelvis may be due to physiological rupture of ovarian follicle. Electronically Signed: By: Elmer Picker M.D. On: 02/01/2022 13:10       Assessment and Plan:     1. Mild dysplasia of cervix (CIN I) Discussed diagnosis with patient.  Stressed that she needs to return for pap smear and HPV testing in one year. Will do yearly paps until 2-3 are normal before resuming normal screening.   2. Amenorrhea, secondary Lab evaluation done today, unremarkable ultrasound. Patient did not tolerate Provera challenge. Further management will depend on results. - FSH/LH - Estradiol - TSH Rfx on Abnormal to Free T4 - Prolactin - Testosterone,Free and Total - Beta hCG quant (ref lab)  Routine preventative health maintenance measures emphasized. Please refer to After Visit Summary for other counseling recommendations.   Return for any gynecologic concerns.    I spent 30 minutes dedicated to the care of this patient including pre-visit review of records, face to face time with the patient discussing her conditions and treatments and post visit orders.    Verita Schneiders, MD, [place] [facility] [employer],  [place]

## 2022-04-30 LAB — FSH/LH
FSH: 115 m[IU]/mL
LH: 80.7 m[IU]/mL

## 2022-04-30 LAB — BETA HCG QUANT (REF LAB): hCG Quant: 3 m[IU]/mL

## 2022-04-30 LAB — TESTOSTERONE,FREE AND TOTAL
Testosterone, Free: 1.7 pg/mL (ref 0.0–4.2)
Testosterone: 15 ng/dL (ref 4–50)

## 2022-04-30 LAB — ESTRADIOL: Estradiol: <5 pg/mL

## 2022-04-30 LAB — TSH RFX ON ABNORMAL TO FREE T4: TSH: 2.13 u[IU]/mL (ref 0.450–4.500)

## 2022-04-30 LAB — PROLACTIN: Prolactin: 14.5 ng/mL (ref 4.8–23.3)

## 2022-05-01 ENCOUNTER — Encounter: Payer: Self-pay | Admitting: Obstetrics & Gynecology

## 2022-05-01 DIAGNOSIS — E2839 Other primary ovarian failure: Secondary | ICD-10-CM | POA: Insufficient documentation

## 2022-05-01 NOTE — Progress Notes (Signed)
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 No intervention needed.  She may have menopausal symptoms and can seek treatment if symptoms are concerning to her. Please call to inform patient of results and recommendations. [place] speaking.

## 2022-05-07 NOTE — Progress Notes (Signed)
TC using [place] interpreter [phone]. Pt informed of results and recommendations. All questions answered. Pt verbalized understanding. Denies current symptoms of menopause. Advised to follow up as needed in the office.

## 2022-06-11 IMAGING — US US PELVIS COMPLETE WITH TRANSVAGINAL
1 series · 12 of 25 positions shown · non-contrast
Comparison: 03/26/2021
COMPARISON: 03/26/2021

Addendum:
CLINICAL DATA: Pelvic pain, amenorrhea x4 months

EXAM:
TRANSABDOMINAL AND TRANSVAGINAL ULTRASOUND OF PELVIS
DOPPLER ULTRASOUND OF OVARIES
TECHNIQUE: Both transabdominal and transvaginal ultrasound examinations of the
pelvis were performed. Transabdominal technique was performed for
global imaging of the pelvis including uterus, ovaries, adnexal
regions, and pelvic cul-de-sac.
It was necessary to proceed with endovaginal exam following the
transabdominal exam to visualize the endometrium and ovaries. Color
and duplex Doppler ultrasound was utilized to evaluate blood flow to
the ovaries.

[Series 1: us pelvic complete with transvaginal · 12 of 119 slices shown]
[im 5/119]
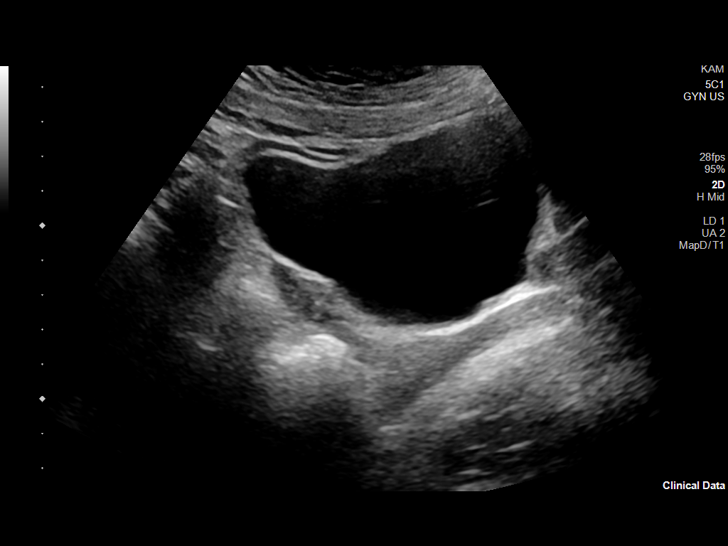
[im 15/119]
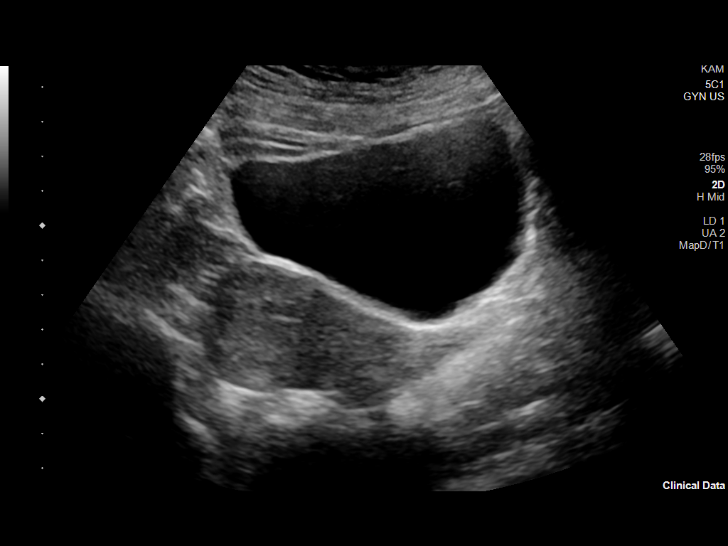
[im 25/119]
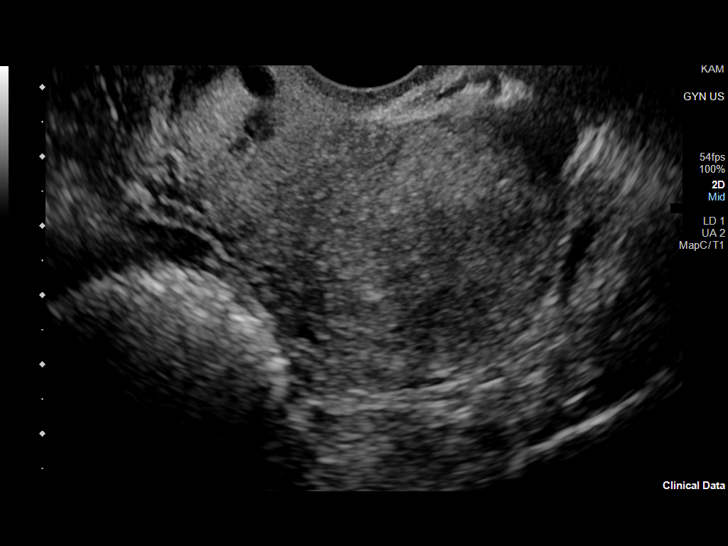
[im 35/119]
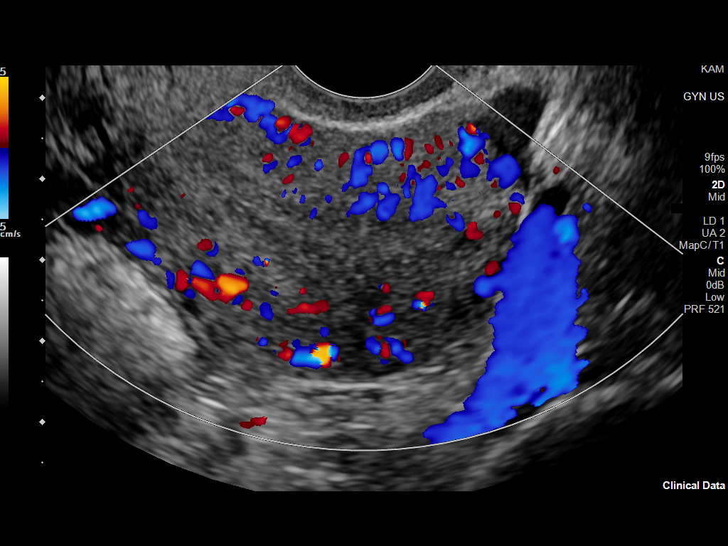
[im 45/119]
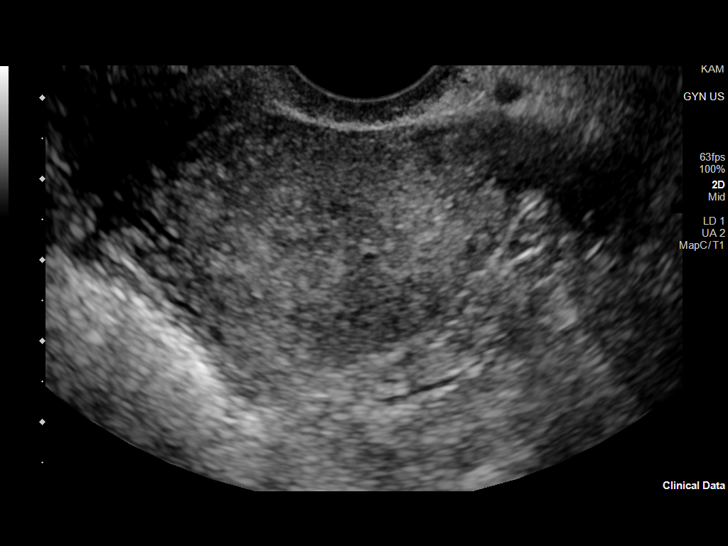
[im 55/119]
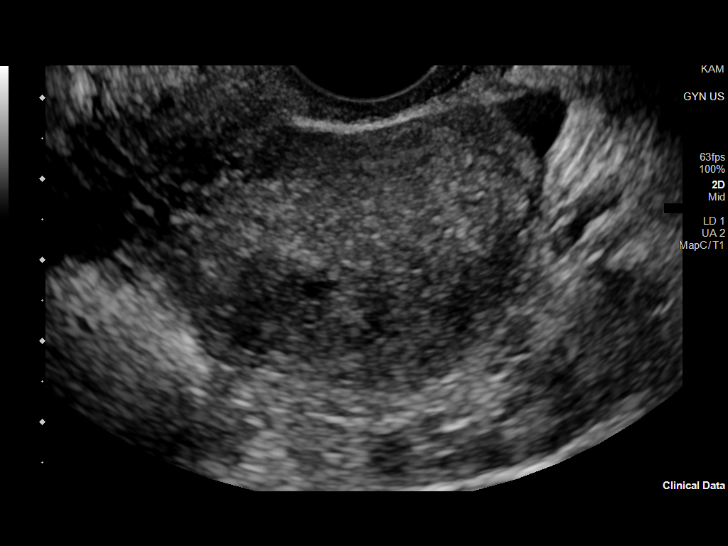
[im 64/119]
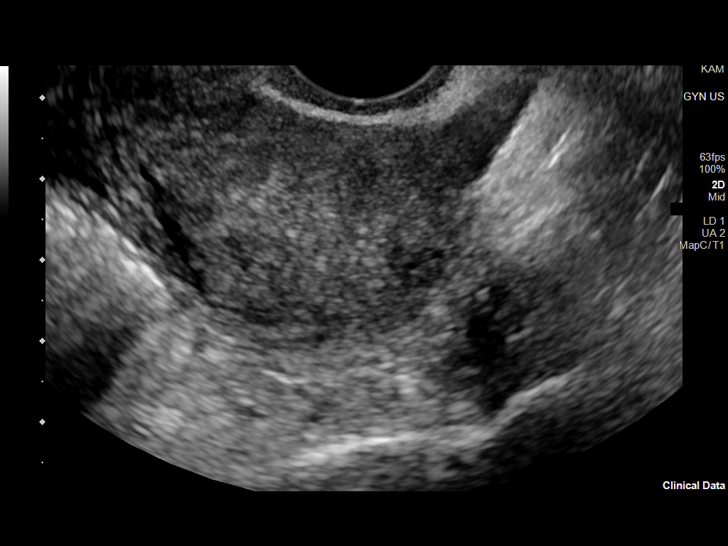
[im 74/119]
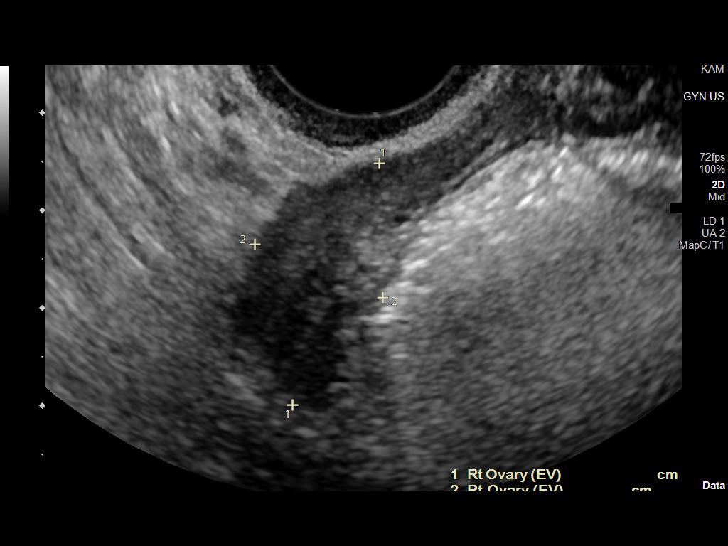
[im 84/119]
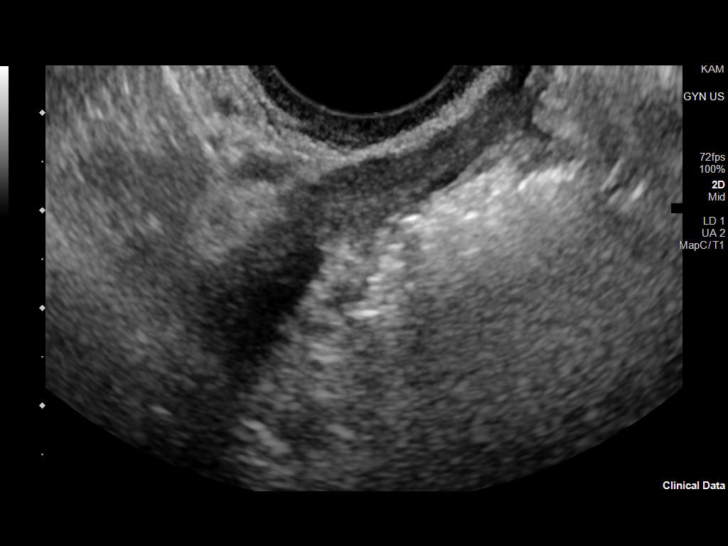
[im 94/119]
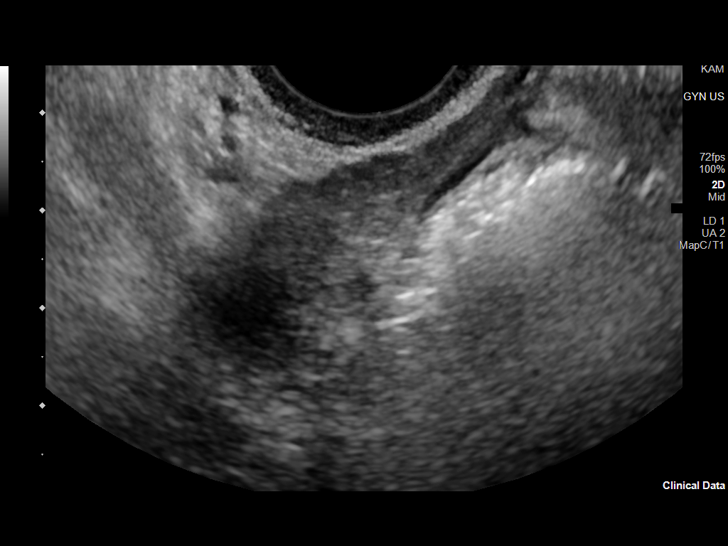
[im 104/119]
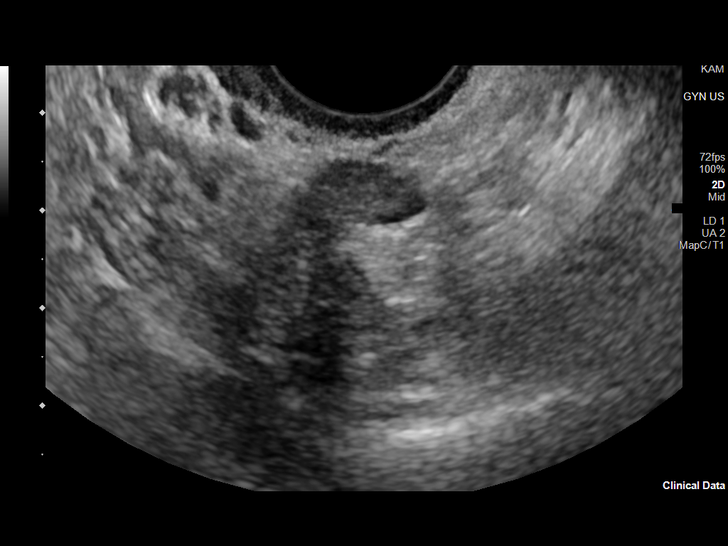
[im 114/119]
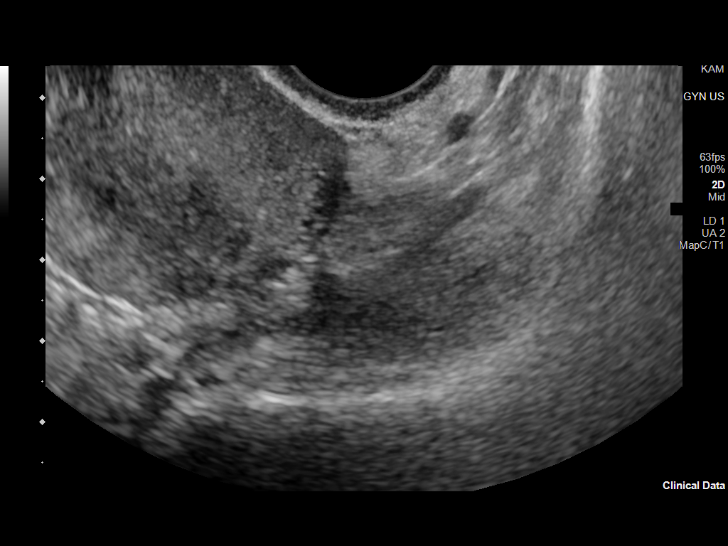

[12 of 25 positions shown; findings below may reference images not displayed]

FINDINGS: Uterus

Measurements: 6.3 x 4.2 x 4.3 cm = volume: 60.2 mL. Uterus is
retroverted. There is inhomogeneous echogenicity in myometrium.
Possibility of adenomyosis is not excluded. There is 9 x 6 mm
hypoechoic structure in the fundus of the uterus, possibly small
fibroid. There is another ill-defined 2 x 1.4 cm hypoechoic area in
the posterior right side of fundus.

Endometrium

Thickness: 8.8 mm.  No focal abnormality visualized.

Right ovary

Measurements: 2.6 x 1.4 x 0.9 cm = volume: 1.7 mL. Normal
appearance/no adnexal mass.

Left ovary

Measurements: 2.1 x 1 x 1.2 cm = volume: 1.3 mL. Normal
appearance/no adnexal mass.

Pulsed Doppler evaluation of both ovaries demonstrates normal
low-resistance arterial and venous waveforms.

Other findings

Trace amount of free fluid is seen in the pelvis. This may be
physiological due to recent rupture of ovarian follicle.
IMPRESSION: Retroverted uterus with inhomogeneous echogenicity in myometrium.
There are possible small fibroids in the fundus. There is diffusely
increased echogenicity in the myometrium suggesting possible
adenomyosis.

Trace amount of free fluid in the pelvis may be due to physiological
rupture of ovarian follicle.

ADDENDUM:
Pulse Doppler examination was not performed in the current study.
Color Doppler examination was performed.

*** End of Addendum ***
FINDINGS: Uterus

Measurements: 6.3 x 4.2 x 4.3 cm = volume: 60.2 mL. Uterus is
retroverted. There is inhomogeneous echogenicity in myometrium.
Possibility of adenomyosis is not excluded. There is 9 x 6 mm
hypoechoic structure in the fundus of the uterus, possibly small
fibroid. There is another ill-defined 2 x 1.4 cm hypoechoic area in
the posterior right side of fundus.

Endometrium

Thickness: 8.8 mm.  No focal abnormality visualized.

Right ovary

Measurements: 2.6 x 1.4 x 0.9 cm = volume: 1.7 mL. Normal
appearance/no adnexal mass.

Left ovary

Measurements: 2.1 x 1 x 1.2 cm = volume: 1.3 mL. Normal
appearance/no adnexal mass.

Pulsed Doppler evaluation of both ovaries demonstrates normal
low-resistance arterial and venous waveforms.

Other findings

Trace amount of free fluid is seen in the pelvis. This may be
physiological due to recent rupture of ovarian follicle.
IMPRESSION: Retroverted uterus with inhomogeneous echogenicity in myometrium.
There are possible small fibroids in the fundus. There is diffusely
increased echogenicity in the myometrium suggesting possible
adenomyosis.

Trace amount of free fluid in the pelvis may be due to physiological
rupture of ovarian follicle.

## 2023-05-31 ENCOUNTER — Encounter (HOSPITAL_COMMUNITY): Payer: Self-pay

## 2023-05-31 ENCOUNTER — Ambulatory Visit (INDEPENDENT_AMBULATORY_CARE_PROVIDER_SITE_OTHER): Payer: No Typology Code available for payment source

## 2023-05-31 ENCOUNTER — Ambulatory Visit (HOSPITAL_COMMUNITY)
Admission: EM | Admit: 2023-05-31 | Discharge: 2023-05-31 | Disposition: A | Discharge status: Home / Self Care | Payer: No Typology Code available for payment source | Attending: Emergency Medicine | Admitting: Emergency Medicine

## 2023-05-31 DIAGNOSIS — Z23 Encounter for immunization: Secondary | ICD-10-CM

## 2023-05-31 DIAGNOSIS — S92355A Nondisplaced fracture of fifth metatarsal bone, left foot, initial encounter for closed fracture: Secondary | ICD-10-CM

## 2023-05-31 DIAGNOSIS — M79672 Pain in left foot: Secondary | ICD-10-CM

## 2023-05-31 DIAGNOSIS — W19XXXA Unspecified fall, initial encounter: Secondary | ICD-10-CM

## 2023-05-31 MED ORDER — IBUPROFEN 800 MG PO TABS: 800.0000 mg | ORAL_TABLET | Freq: Three times a day (TID) | ORAL | 0 refills | Status: AC

## 2023-05-31 MED ORDER — TETANUS-DIPHTH-ACELL PERTUSSIS 5-2.5-18.5 LF-MCG/0.5 IM SUSY
0.5000 mL | PREFILLED_SYRINGE | Freq: Once | INTRAMUSCULAR | Status: AC
Start: 2023-05-31 — End: 2023-05-31
  Administered 2023-05-31: 0.5 mL via INTRAMUSCULAR

## 2023-05-31 MED ORDER — HYDROCODONE-ACETAMINOPHEN 5-325 MG PO TABS: 2.0000 | ORAL_TABLET | Freq: Four times a day (QID) | ORAL | 0 refills | Status: AC | PRN

## 2023-05-31 MED ORDER — TETANUS-DIPHTH-ACELL PERTUSSIS 5-2.5-18.5 LF-MCG/0.5 IM SUSY
PREFILLED_SYRINGE | INTRAMUSCULAR | Status: AC
  Filled 2023-05-31: qty 0.5

## 2023-05-31 NOTE — ED Notes (Signed)
Used interpreter services for all communications during visit. Teachings provided for cam walker and crutches

## 2023-05-31 NOTE — ED Triage Notes (Signed)
History from [facility] that pt tripped over a rock yesterday and injured her left foot. Left foot has swelling and a sm wound noted. Pt states she took 2 Advil with very little relief.

## 2023-05-31 NOTE — Discharge Instructions (Addendum)
Hy ??m b?o r?ng b?n khng mang b?t k? tr?ng l??ng no ln chn tri v ?i giy trong su?t c? ngy, s? d?ng n?ng ?? ??m b?o khng c tr?ng l??ng no ? ln chn tri. G?i cho bc s? ch?nh hnh ?? theo di sau 7-10 ngy. V b?n b? gy x??ng nhi?u l?n nn nguy c? lnh v?t th??ng khng ?ng cch cao h?n, v v?y vi?c theo di ch?nh hnh l r?t quan tr?ng. B?n c th? thay ??i gi?a ibuprofen v Tylenol ho?c Norco sau m?i 4-6 gi? ?? gi?m ?au. Norco l thu?c gy m, khng ???c u?ng r??u ho?c li xe khi dng thu?c ny v n c th? gy bu?n ng?. N c?ng c th? gy to bn.  Hy tm ki?m s? ch?m [place] ngay l?p t?c n?u b?n b? t, ng?a ran, ?au t?ng ??t ng?t, s?t ho?c b?t k? tri?u ch?ng ?ng lo ng?i m?i no.  Please ensure you are not bearing any weight on your left foot and wear the boot throughout the day, using the crutches to ensure no weight on your left foot. Call the orthopedic for a follow-up in 7-10 days. Since you have multiple fractures there is a higher risk for improper healing, so orthopedic follow-up is very important. You can alternate between the ibuprofen and Tylenol or the Norco every 4-6 hours for pain.  The Norco is a narcotic, do not drink or drive on this medication as it may cause drowsiness.  It may also cause constipation.  Seek immediate care if you develop numbness, tingling, sudden increase in pain, fever, or any new concerning symptoms.

## 2023-05-31 NOTE — ED Provider Notes (Signed)
[facility]    CSN: [id number] Arrival date & time: 05/31/23  1103      History   Chief Complaint Chief Complaint  Patient presents with   Fall   Foot Pain    HPI Alexandra Lynn is a 42 y.o. female.   Patient presents to clinic over concern of left foot pain.  She tripped, falling forward over a rock and injuring her left foot yesterday.  Her foot went underneath her and inward.  She is unable to walk today and is in a wheelchair.  She has swelling and bruising to the 3rd, 4th and 5th metacarpal area.  She is unsure when her last Tdap was. Has taken Advil twice today, last dose around 10 am.    The history is provided by the patient and medical records. The history is limited by a language barrier. A language interpreter was used.  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 Authorizing Provider  HYDROcodone-acetaminophen (NORCO/VICODIN) 5-325 MG tablet Take 2 tablets by mouth every 6 (six) hours as needed for up to 5 days. 05/31/23 06/05/23 Yes Rinaldo Ratel, [place] N, FNP  ibuprofen (ADVIL) 800 MG tablet Take 1 tablet (800 mg total) by mouth 3 (three) times daily. 05/31/23  Yes Rinaldo Ratel, [place] N, FNP  Prenatal Vit-Iron Carbonyl-FA (PRENATAL PLUS IRON) 29-1  MG TABS Take 1 tablet by mouth daily. 03/15/22  Yes Brock Bad, MD  medroxyPROGESTERone (DEPO-PROVERA) 150 MG/ML injection Inject 1 mL (150 mg total) into the muscle every 3 (three) months. 12/29/15 09/01/20  Roe Coombs, CNM    Family History Family History  Problem Relation Age of Onset   Hearing loss Neg Hx    Anesthesia problems Neg Hx     Social History Social History   Tobacco Use   Smoking status: Never    Passive exposure: Never   Smokeless tobacco: Never  Vaping Use   Vaping status: Never Used  Substance Use Topics   Alcohol use: No    Alcohol/week: 0.0 standard drinks of alcohol   Drug use: No     Allergies   Patient has no known allergies.   Review of Systems Review of Systems  Musculoskeletal:  Positive for gait problem.  Skin:  Positive for wound.     Physical Exam Triage Vital Signs ED Triage Vitals  Encounter Vitals Group     BP 05/31/23 1223 117/76     Systolic BP Percentile --  Diastolic BP Percentile --      Pulse Rate 05/31/23 1223 74     Resp 05/31/23 1223 18     Temp 05/31/23 1223 97.8 F (36.6 C)     Temp Source 05/31/23 1223 Oral     SpO2 05/31/23 1223 97 %     Weight --      Height --      Head Circumference --      Peak Flow --      Pain Score 05/31/23 1224 9     Pain Loc --      Pain Education --      Exclude from Growth Chart --    No data found.  Updated Vital Signs BP 117/76 (BP Location: Right Arm)   Pulse 74   Temp 97.8 F (36.6 C) (Oral)   Resp 18   LMP 04/28/2023   SpO2 97%   Visual Acuity Right Eye Distance:   Left Eye Distance:   Bilateral Distance:    Right Eye Near:   Left Eye Near:    Bilateral Near:     Physical Exam Vitals and nursing note reviewed.  Constitutional:      Appearance: Normal appearance.  HENT:     Head: Normocephalic and atraumatic.     Right Ear: External ear normal.     Left Ear: External ear normal.     Nose: Nose normal.     Mouth/Throat:     Mouth:  Mucous membranes are moist.  Eyes:     Conjunctiva/sclera: Conjunctivae normal.  Cardiovascular:     Rate and Rhythm: Normal rate.     Pulses: Normal pulses.          Dorsalis pedis pulses are 2+ on the left side.       Posterior tibial pulses are 2+ on the left side.  Pulmonary:     Effort: Pulmonary effort is normal.  Musculoskeletal:        General: Swelling, tenderness and signs of injury present. No deformity. Normal range of motion.       Feet:  Feet:     Comments: Bruising along dorsum of left foot along 3rd, 4th and 5th metatarsal. ROM intact.  Brisk capillary refill and 2+ pedal pulses.  Ankle range of motion intact, no bruising or swelling. Skin:    General: Skin is warm and dry.     Capillary Refill: Capillary refill takes less than 2 seconds.     Findings: Bruising and lesion present.  Neurological:     General: No focal deficit present.     Mental Status: She is alert and oriented to person, place, and time.  Psychiatric:        Mood and Affect: Mood normal.        Behavior: Behavior normal. Behavior is cooperative.      UC Treatments / Results  Labs (all labs ordered are listed, but only abnormal results are displayed) Labs Reviewed - No data to display  EKG   Radiology DG Foot Complete Left  Result Date: 05/31/2023 CLINICAL DATA:  Patient tripped over a rock yesterday injuring left foot. Left foot swelling. Pain localizes to the third fourth and fifth metatarsal bone. EXAM: LEFT FOOT - COMPLETE 3+ VIEW COMPARISON:  None Available. FINDINGS: Nondisplaced, comminuted, intra-articular fracture is noted involving the base of the fifth metatarsal bone. No additional fracture or subluxation. Lateral soft tissue edema noted. IMPRESSION: Nondisplaced, comminuted, intra-articular fracture involves the base of the fifth metatarsal bone.  Electronically Signed   By: Signa Kell M.D.   On: 05/31/2023 13:00    Procedures Procedures (including critical care  time)  Medications Ordered in UC Medications  Tdap (BOOSTRIX) injection 0.5 mL (0.5 mLs Intramuscular Given 05/31/23 1305)    Initial Impression / Assessment and Plan / UC Course  I have reviewed the triage vital signs and the nursing notes.  Pertinent labs & imaging results that were available during my care of the patient were reviewed by me and considered in my medical decision making (see chart for details).  Vitals and triage reviewed, patient is hemodynamically stable.  Bruising and swelling to dorsum of left foot after fall, unable to bear weight.  Neurovascularly intact.  Imaging shows comminuted fracture at the base of the fifth metatarsal.  Orthopedic on-call consulted who recommended cam boot, nonweightbearing, crutches and orthopedic follow-up in 7 to 10 days.  Pain management discussed.  Plan of care, follow-up care return precautions given, no questions at this time.   Final Clinical Impressions(s) / UC Diagnoses   Final diagnoses:  Closed nondisplaced fracture of fifth metatarsal bone of left foot, initial encounter  Fall, initial encounter     Discharge Instructions      Hy ??m b?o r?ng b?n khng mang b?t k? tr?ng l??ng no ln chn tri v ?i giy trong su?t c? ngy, s? d?ng n?ng ?? ??m b?o khng c tr?ng l??ng no ? ln chn tri. G?i cho bc s? ch?nh hnh ?? theo di sau 7-10 ngy. V b?n b? gy x??ng nhi?u l?n nn nguy c? lnh v?t th??ng khng ?ng cch cao h?n, v v?y vi?c theo di ch?nh hnh l r?t quan tr?ng. B?n c th? thay ??i gi?a ibuprofen v Tylenol ho?c Norco sau m?i 4-6 gi? ?? gi?m ?au. Norco l thu?c gy m, khng ???c u?ng r??u ho?c li xe khi dng thu?c ny v n c th? gy bu?n ng?. N c?ng c th? gy to bn.  Hy tm ki?m s? ch?m [place] ngay l?p t?c n?u b?n b? t, ng?a ran, ?au t?ng ??t ng?t, s?t ho?c b?t k? tri?u ch?ng ?ng lo ng?i m?i no.  Please ensure you are not bearing any weight on your left foot and wear the boot throughout the day, using the  crutches to ensure no weight on your left foot. Call the orthopedic for a follow-up in 7-10 days. Since you have multiple fractures there is a higher risk for improper healing, so orthopedic follow-up is very important. You can alternate between the ibuprofen and Tylenol or the Norco every 4-6 hours for pain.  The Norco is a narcotic, do not drink or drive on this medication as it may cause drowsiness.  It may also cause constipation.  Seek immediate care if you develop numbness, tingling, sudden increase in pain, fever, or any new concerning symptoms.      ED Prescriptions     Medication Sig Dispense Auth. Provider   HYDROcodone-acetaminophen (NORCO/VICODIN) 5-325 MG tablet Take 2 tablets by mouth every 6 (six) hours as needed for up to 5 days. 10 tablet Rinaldo Ratel, [place], [place]   ibuprofen (ADVIL) 800 MG tablet Take 1 tablet (800 mg total) by mouth 3 (three) times daily. 21 tablet Shaundrea Carrigg, [place], [place]      I have reviewed the PDMP during this encounter.   Neola Worrall, [place] N, [place] 05/31/23 [phone]

## 2023-06-10 ENCOUNTER — Ambulatory Visit
Admission: RE | Admit: 2023-06-10 | Discharge: 2023-06-10 | Disposition: A | Discharge status: Home / Self Care | Payer: No Typology Code available for payment source | Source: Ambulatory Visit | Attending: Orthopaedic Surgery

## 2023-06-10 ENCOUNTER — Other Ambulatory Visit: Payer: Self-pay | Admitting: Orthopaedic Surgery

## 2023-06-10 DIAGNOSIS — S92352A Displaced fracture of fifth metatarsal bone, left foot, initial encounter for closed fracture: Secondary | ICD-10-CM

## 2023-06-11 ENCOUNTER — Encounter: Payer: Self-pay | Admitting: Orthopaedic Surgery

## 2023-06-11 DIAGNOSIS — S92352A Displaced fracture of fifth metatarsal bone, left foot, initial encounter for closed fracture: Secondary | ICD-10-CM
